# Patient Record
Sex: Male | Born: 1970 | Race: Black or African American | Hispanic: No | Marital: Single | Smoking: Current every day smoker
Health system: Southern US, Community
[De-identification: ages and names within clinical notes are randomized; demographics above are authoritative.]

## PROBLEM LIST (undated history)

## (undated) DIAGNOSIS — G709 Myoneural disorder, unspecified: Secondary | ICD-10-CM

## (undated) DIAGNOSIS — F32A Depression, unspecified: Secondary | ICD-10-CM

## (undated) DIAGNOSIS — Z9889 Other specified postprocedural states: Secondary | ICD-10-CM

## (undated) DIAGNOSIS — K219 Gastro-esophageal reflux disease without esophagitis: Secondary | ICD-10-CM

## (undated) DIAGNOSIS — R1310 Dysphagia, unspecified: Secondary | ICD-10-CM

## (undated) DIAGNOSIS — Z789 Other specified health status: Secondary | ICD-10-CM

## (undated) DIAGNOSIS — E349 Endocrine disorder, unspecified: Secondary | ICD-10-CM

## (undated) DIAGNOSIS — F640 Transsexualism: Secondary | ICD-10-CM

## (undated) DIAGNOSIS — B20 Human immunodeficiency virus [HIV] disease: Secondary | ICD-10-CM

## (undated) DIAGNOSIS — I10 Essential (primary) hypertension: Secondary | ICD-10-CM

## (undated) DIAGNOSIS — IMO0001 Reserved for inherently not codable concepts without codable children: Secondary | ICD-10-CM

## (undated) DIAGNOSIS — F419 Anxiety disorder, unspecified: Secondary | ICD-10-CM

## (undated) DIAGNOSIS — E785 Hyperlipidemia, unspecified: Secondary | ICD-10-CM

## (undated) DIAGNOSIS — F329 Major depressive disorder, single episode, unspecified: Secondary | ICD-10-CM

## (undated) DIAGNOSIS — I2510 Atherosclerotic heart disease of native coronary artery without angina pectoris: Secondary | ICD-10-CM

## (undated) DIAGNOSIS — F4320 Adjustment disorder, unspecified: Secondary | ICD-10-CM

## (undated) DIAGNOSIS — F1910 Other psychoactive substance abuse, uncomplicated: Secondary | ICD-10-CM

## (undated) DIAGNOSIS — F4323 Adjustment disorder with mixed anxiety and depressed mood: Secondary | ICD-10-CM

## (undated) DIAGNOSIS — R112 Nausea with vomiting, unspecified: Secondary | ICD-10-CM

## (undated) DIAGNOSIS — G510 Bell's palsy: Secondary | ICD-10-CM

## (undated) DIAGNOSIS — I219 Acute myocardial infarction, unspecified: Secondary | ICD-10-CM

## (undated) HISTORY — DX: Depression, unspecified: F32.A

## (undated) HISTORY — DX: Atherosclerotic heart disease of native coronary artery without angina pectoris: I25.10

## (undated) HISTORY — PX: ENDOVASCULAR STENT INSERTION: SHX5161

## (undated) HISTORY — DX: Bell's palsy: G51.0

## (undated) HISTORY — DX: Adjustment disorder with mixed anxiety and depressed mood: F43.23

## (undated) HISTORY — DX: Hyperlipidemia, unspecified: E78.5

## (undated) HISTORY — DX: Endocrine disorder, unspecified: E34.9

## (undated) HISTORY — DX: Dysphagia, unspecified: R13.10

## (undated) HISTORY — DX: Myoneural disorder, unspecified: G70.9

## (undated) HISTORY — DX: Acute myocardial infarction, unspecified: I21.9

## (undated) HISTORY — DX: Adjustment disorder, unspecified: F43.20

## (undated) HISTORY — DX: Essential (primary) hypertension: I10

## (undated) HISTORY — DX: Anxiety disorder, unspecified: F41.9

## (undated) HISTORY — DX: Reserved for inherently not codable concepts without codable children: IMO0001

## (undated) HISTORY — DX: Gastro-esophageal reflux disease without esophagitis: K21.9

## (undated) HISTORY — DX: Transsexualism: F64.0

## (undated) HISTORY — DX: Other psychoactive substance abuse, uncomplicated: F19.10

## (undated) HISTORY — DX: Major depressive disorder, single episode, unspecified: F32.9

## (undated) HISTORY — DX: Human immunodeficiency virus (HIV) disease: B20

---
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
## 2001-11-28 ENCOUNTER — Emergency Department (HOSPITAL_COMMUNITY): Admission: EM | Admit: 2001-11-28 | Discharge: 2001-11-28 | Payer: Self-pay | Admitting: Emergency Medicine

## 2001-11-29 ENCOUNTER — Emergency Department (HOSPITAL_COMMUNITY): Admission: EM | Admit: 2001-11-29 | Discharge: 2001-11-29 | Payer: Self-pay | Admitting: Emergency Medicine

## 2004-04-25 ENCOUNTER — Inpatient Hospital Stay (HOSPITAL_COMMUNITY): Admission: EM | Admit: 2004-04-25 | Discharge: 2004-04-28 | Payer: Self-pay | Admitting: Emergency Medicine

## 2007-08-14 ENCOUNTER — Ambulatory Visit: Payer: Self-pay | Admitting: Family Medicine

## 2007-08-15 ENCOUNTER — Ambulatory Visit: Payer: Self-pay | Admitting: *Deleted

## 2007-09-04 ENCOUNTER — Ambulatory Visit: Payer: Self-pay | Admitting: Family Medicine

## 2007-10-20 ENCOUNTER — Ambulatory Visit: Payer: Self-pay | Admitting: Family Medicine

## 2007-10-26 ENCOUNTER — Emergency Department (HOSPITAL_COMMUNITY): Admission: EM | Admit: 2007-10-26 | Discharge: 2007-10-26 | Payer: Self-pay | Admitting: Emergency Medicine

## 2007-12-11 ENCOUNTER — Ambulatory Visit: Payer: Self-pay | Admitting: Family Medicine

## 2007-12-11 LAB — CONVERTED CEMR LAB
ALT: 33 units/L (ref 0–53)
BUN: 18 mg/dL (ref 6–23)
CO2: 20 meq/L (ref 19–32)
Calcium: 8.8 mg/dL (ref 8.4–10.5)
Chloride: 109 meq/L (ref 96–112)
Cholesterol: 168 mg/dL (ref 0–200)
Creatinine, Ser: 0.96 mg/dL (ref 0.40–1.50)
Eosinophils Absolute: 0.1 10*3/uL — ABNORMAL LOW (ref 0.2–0.7)
Eosinophils Relative: 1 % (ref 0–5)
Free T4: 1.31 ng/dL (ref 0.89–1.80)
Glucose, Bld: 89 mg/dL (ref 70–99)
HCT: 43.9 % (ref 39.0–52.0)
HIV-2 Ab: NEGATIVE
Hemoglobin: 14.1 g/dL (ref 13.0–17.0)
Lymphocytes Relative: 41 % (ref 12–46)
Lymphs Abs: 4.7 10*3/uL — ABNORMAL HIGH (ref 0.7–4.0)
MCV: 84.9 fL (ref 78.0–100.0)
Monocytes Absolute: 1.1 10*3/uL — ABNORMAL HIGH (ref 0.1–1.0)
Monocytes Relative: 10 % (ref 3–12)
Total Bilirubin: 0.5 mg/dL (ref 0.3–1.2)
Total CHOL/HDL Ratio: 5.8
Triglycerides: 249 mg/dL — ABNORMAL HIGH (ref <150)
WBC: 11.6 10*3/uL — ABNORMAL HIGH (ref 4.0–10.5)

## 2007-12-18 ENCOUNTER — Encounter (INDEPENDENT_AMBULATORY_CARE_PROVIDER_SITE_OTHER): Payer: Self-pay | Admitting: Family Medicine

## 2007-12-18 ENCOUNTER — Ambulatory Visit: Payer: Self-pay | Admitting: Internal Medicine

## 2007-12-18 LAB — CONVERTED CEMR LAB
Absolute CD4: 956 #/uL (ref 381–1469)
Hep B S Ab: NEGATIVE
Hepatitis B Surface Ag: NEGATIVE
Total Lymphocytes %: 41 % (ref 12–46)
Total lymphocyte count: 4551 cells/mcL — ABNORMAL HIGH (ref 700–3300)

## 2008-01-05 ENCOUNTER — Ambulatory Visit: Payer: Self-pay | Admitting: Internal Medicine

## 2009-08-23 ENCOUNTER — Emergency Department (HOSPITAL_COMMUNITY): Admission: EM | Admit: 2009-08-23 | Discharge: 2009-08-24 | Payer: Self-pay | Admitting: Emergency Medicine

## 2009-12-08 LAB — CONVERTED CEMR LAB
CD4 Count: 465 microliters
Glucose, Bld: 77 mg/dL
HCV Ab: NEGATIVE
HIV 1 RNA Quant: 242580 copies/mL
RPR Ser Ql: NONREACTIVE

## 2010-03-03 LAB — CONVERTED CEMR LAB
Hemoglobin: 12.3 g/dL
WBC: 12 10*3/uL

## 2010-05-09 LAB — CONVERTED CEMR LAB
CD4 Count: 720 microliters
HIV 1 RNA Quant: 260 copies/mL
WBC: 9.7 10*3/uL

## 2010-07-28 ENCOUNTER — Telehealth: Payer: Self-pay

## 2010-07-28 DIAGNOSIS — B20 Human immunodeficiency virus [HIV] disease: Secondary | ICD-10-CM | POA: Insufficient documentation

## 2010-08-14 ENCOUNTER — Encounter: Payer: Self-pay | Admitting: Infectious Disease

## 2010-08-14 ENCOUNTER — Encounter (INDEPENDENT_AMBULATORY_CARE_PROVIDER_SITE_OTHER): Payer: Self-pay | Admitting: *Deleted

## 2010-08-14 ENCOUNTER — Telehealth: Payer: Self-pay

## 2010-08-15 ENCOUNTER — Encounter: Payer: Self-pay | Admitting: Infectious Disease

## 2010-08-15 ENCOUNTER — Ambulatory Visit: Payer: Self-pay | Admitting: Infectious Diseases

## 2010-08-15 LAB — CONVERTED CEMR LAB
Albumin: 3.9 g/dL (ref 3.5–5.2)
Alkaline Phosphatase: 111 units/L (ref 39–117)
CO2: 21 meq/L (ref 19–32)
Chloride: 104 meq/L (ref 96–112)
Cholesterol: 230 mg/dL — ABNORMAL HIGH (ref 0–200)
Glucose, Bld: 123 mg/dL — ABNORMAL HIGH (ref 70–99)
HIV-1 RNA Quant, Log: 2.33 — ABNORMAL HIGH (ref <1.68)
HIV-1 antibody: POSITIVE — AB
Hep A Total Ab: POSITIVE — AB
Hep B S Ab: POSITIVE — AB
LDL Cholesterol: 131 mg/dL — ABNORMAL HIGH (ref 0–99)
Lymphocytes Relative: 34 % (ref 12–46)
Lymphs Abs: 3.4 10*3/uL (ref 0.7–4.0)
Neutro Abs: 6.1 10*3/uL (ref 1.7–7.7)
Neutrophils Relative %: 60 % (ref 43–77)
Platelets: 394 10*3/uL (ref 150–400)
Potassium: 3.8 meq/L (ref 3.5–5.3)
Sodium: 137 meq/L (ref 135–145)
Total Protein: 7.8 g/dL (ref 6.0–8.3)
Triglycerides: 304 mg/dL — ABNORMAL HIGH (ref <150)
WBC: 10.1 10*3/uL (ref 4.0–10.5)

## 2010-08-31 ENCOUNTER — Encounter (INDEPENDENT_AMBULATORY_CARE_PROVIDER_SITE_OTHER): Payer: Self-pay | Admitting: *Deleted

## 2010-09-06 ENCOUNTER — Ambulatory Visit: Payer: Self-pay | Admitting: Infectious Disease

## 2010-09-06 DIAGNOSIS — I2510 Atherosclerotic heart disease of native coronary artery without angina pectoris: Secondary | ICD-10-CM | POA: Insufficient documentation

## 2010-09-06 DIAGNOSIS — E785 Hyperlipidemia, unspecified: Secondary | ICD-10-CM | POA: Insufficient documentation

## 2010-09-06 DIAGNOSIS — G609 Hereditary and idiopathic neuropathy, unspecified: Secondary | ICD-10-CM | POA: Insufficient documentation

## 2010-09-08 ENCOUNTER — Encounter (INDEPENDENT_AMBULATORY_CARE_PROVIDER_SITE_OTHER): Payer: Self-pay | Admitting: *Deleted

## 2010-09-11 ENCOUNTER — Encounter (INDEPENDENT_AMBULATORY_CARE_PROVIDER_SITE_OTHER): Payer: Self-pay | Admitting: *Deleted

## 2010-09-12 ENCOUNTER — Telehealth (INDEPENDENT_AMBULATORY_CARE_PROVIDER_SITE_OTHER): Payer: Self-pay | Admitting: *Deleted

## 2010-09-13 ENCOUNTER — Encounter (INDEPENDENT_AMBULATORY_CARE_PROVIDER_SITE_OTHER): Payer: Self-pay | Admitting: *Deleted

## 2010-09-27 ENCOUNTER — Encounter (INDEPENDENT_AMBULATORY_CARE_PROVIDER_SITE_OTHER): Payer: Self-pay | Admitting: *Deleted

## 2010-09-27 ENCOUNTER — Telehealth (INDEPENDENT_AMBULATORY_CARE_PROVIDER_SITE_OTHER): Payer: Self-pay | Admitting: *Deleted

## 2010-10-02 ENCOUNTER — Encounter (INDEPENDENT_AMBULATORY_CARE_PROVIDER_SITE_OTHER): Payer: Self-pay | Admitting: *Deleted

## 2010-11-16 ENCOUNTER — Telehealth: Payer: Self-pay | Admitting: Internal Medicine

## 2011-01-08 ENCOUNTER — Telehealth: Payer: Self-pay | Admitting: Internal Medicine

## 2011-01-15 ENCOUNTER — Telehealth: Payer: Self-pay | Admitting: Infectious Disease

## 2011-01-31 ENCOUNTER — Encounter (INDEPENDENT_AMBULATORY_CARE_PROVIDER_SITE_OTHER): Payer: Self-pay | Admitting: *Deleted

## 2011-02-01 NOTE — Letter (Signed)
Summary: Douglas Salazar: Income Verification  Douglas Salazar: Income Verification   Imported By: Florinda Marker 08/21/2010 16:04:27  _____________________________________________________________________  External Attachment:    Type:   Image     Comment:   External Document

## 2011-02-01 NOTE — Progress Notes (Signed)
Summary: Pt assist med arrived 3 mos supply nxt reorder due 11/12/10  Prescriptions: NEURONTIN 600 MG TABS (GABAPENTIN) one three times a day  #300 x 0   Entered by:   Paulo Fruit  BS,CPht II,MPH   Authorized by:   Acey Lav MD   Signed by:   Paulo Fruit  BS,CPht II,MPH on 09/27/2010   Method used:   Samples Given   RxID:   [id number]   Patient Assist Medication Verification: Medication: neurontin 600mg  Lot# [id number] Exp Date:Jul 13 Tech approval:mld  Sent a flag to the [facility] to inform the patient that this medication is available for pick up at the office Paulo Fruit  BS,CPht II,MPH  September 27, 2010 8:57 AM

## 2011-02-01 NOTE — Miscellaneous (Signed)
Summary: Bridge Counselor  Clinical Lists Changes BC mailed pt's RW card today at pt's request. Pt's application for assistance with his nuerontin was mailed today as well. If pt is approved, his medication will be shipped to [facility]. BC will coordinate with Byrd Hesselbach to dispense pt's medicaiton if pt is approved.  Sharol Roussel  September 14, 2010 4:35 PM

## 2011-02-01 NOTE — Progress Notes (Signed)
Summary: Requesting patient assistance meds.   Phone Note Call from Patient   Summary of Call: patient's Mother her to pick up medications.  She states they come from patient's assistance.  We were not aware they needed meds.  We will call to request medications.  Call Elberta Leatherwood (Mother) [phone]. Tomasita Morrow RN  January 08, 2011 4:54 PM

## 2011-02-01 NOTE — Progress Notes (Signed)
Summary: No show for new patient intake 07-27-10  Phone Note Outgoing Call   Call placed by: Tomasita Morrow RN,  July 28, 2010 9:54 AM Call placed to: [facility] counselor Summary of Call: Pt was a no show for new patient referral.   He was referral from the [facility] . I will make referral to [facility] for assistance.  Tomasita Morrow RN  July 28, 2010 9:56 AM   New Problems: HIV INFECTION (ICD-042)   New Problems: HIV INFECTION (ICD-042)

## 2011-02-01 NOTE — Miscellaneous (Signed)
Summary: Bridge Counselor  Clinical Lists Changes BC assisted pt with applying for a patient assistance program through [employer] for his nuerotin. BC gave the prescriber section to Tameka to have Dr. Daiva Eves sign off on. Once [facility] receives the doctor's signature, [facility] will mail the application and support letter. BC will coordinate with Byrd Hesselbach when the medicaiton is to arrive at the clinic.  Sharol Roussel  September 12, 2010 2:46 PM

## 2011-02-01 NOTE — Miscellaneous (Signed)
Summary: Bridge Counselor  Clinical Lists Changes Pt was approved for the patient assistance program to access his neurotin. BC called pt to let him know that his medication is here for pick up. Pt will be in tomorrow to see Byrd Hesselbach and get the medication. Pt very pleased.  Sharol Roussel  September 27, 2010 3:18 PM

## 2011-02-01 NOTE — Miscellaneous (Signed)
Summary: clinical update/ryan white ncadap apprv til 03/31/11  Clinical Lists Changes  Observations: Added new observation of RWTITLE: B (09/08/2010 11:22) Added new observation of PAYOR: No Insurance (09/08/2010 11:22) Added new observation of INCOMESOURCE: none (09/08/2010 11:22) Added new observation of HOUSEINCOME: 0  (09/08/2010 11:22) Added new observation of #CHILD<18 IN: No  (09/08/2010 11:22) Added new observation of FAMILYSIZE: 1  (09/08/2010 11:22) Added new observation of HOUSING: Temporary  (09/08/2010 11:22) Added new observation of FINASSESSDT: 08/15/2010  (09/08/2010 11:22) Added new observation of YEARLYEXPEN: 0  (09/08/2010 11:22) Added new observation of INFECTDIS MD: Philipp Deputy  (09/08/2010 11:22) Added new observation of RACE: American Indian/[place] Native  (09/08/2010 11:22) Added new observation of GENDER: Male  (09/08/2010 11:22) Added new observation of MARITAL STAT: Single  (09/08/2010 11:22) Added new observation of LATINO/HISP: No  (09/08/2010 11:22) Added new observation of REC_MESSAGE: Yes  (09/08/2010 11:22) Added new observation of RECPHONECALL: Yes  (09/08/2010 11:22) Added new observation of REC_MAIL: No  (09/08/2010 11:22) Added new observation of RW VITAL STA: Active  (09/08/2010 11:22) Added new observation of PATNTCOUNTY: [place]  (09/08/2010 11:22) Added new observation of RWPARTICIP: Yes  (09/08/2010 11:22) Added new observation of AIDSDAP: Yes 2011  (09/08/2010 11:22)

## 2011-02-01 NOTE — Progress Notes (Signed)
  Phone Note Refill Request   Refills Requested: Medication #1:  NEURONTIN 600 MG TABS one three times a day  Medication #2:  PERCOCET 5-325 MG TABS take one tablet up to three times daily as needed pain Initial call taken by: Jimmy Footman, CMA,  November 16, 2010 12:06 PM    Prescriptions: NEURONTIN 600 MG TABS (GABAPENTIN) one three times a day  #300 x 1   Entered by:   Tomasita Morrow RN   Authorized by:   Yisroel Ramming MD   Signed by:   Tomasita Morrow RN on 11/16/2010   Method used:   Print then Give to Patient   RxID:   [id number] PERCOCET 5-325 MG TABS (OXYCODONE-ACETAMINOPHEN) take one tablet up to three times daily as needed pain  #90 x 0   Entered by:   Tomasita Morrow RN   Authorized by:   Yisroel Ramming MD   Signed by:   Tomasita Morrow RN on 11/16/2010   Method used:   Print then Give to Patient   RxID:   [id number]

## 2011-02-01 NOTE — Progress Notes (Signed)
Summary: Pt returning to care   Phone Note Other Incoming   Caller: Bridge counselor - Soledad Gerlach  Summary of Call: pt needs new OV and labs after recent incarceration ending 07-19-10 Tomasita Morrow RN  August 14, 2010 2:50 PM Labs and OV given with Dr Farrel Demark RN  August 14, 2010 2:54 PM   Initial call taken by: Tomasita Morrow RN,  August 14, 2010 2:51 PM

## 2011-02-01 NOTE — Miscellaneous (Signed)
Summary: Bridge Counselor  Clinical Lists Changes Pt into BC's office today. Pt brought his ADAP approval letter. BC gave a copy of this to [facility] for pt's ADAP file. Pt will see Dr. Daiva Eves on 09/06/10 to renew his Rx.  Sharol Roussel  August 31, 2010 12:23 PM

## 2011-02-01 NOTE — Assessment & Plan Note (Signed)
Summary: new 042/tkk   Visit Type:  Consult  CC:  new patient id.  History of Present Illness: 40 year old man previously diagnosed with HIV in 61, largely treated in [place]. Not known to me at time of visit but has recently completed time in a correctional facility in [place]. He has been on [place] for years and claims to have been well controlle, though only recent labs here have shown good virological control. Patient has additional comorbid conditions of CAD and is sp cardiac catheriztion. He claims that Dr. Sharyn Lull took him off of all his cardiac meds including his statin, plavix, asprin and bp meds. ( I do not have records on this). He is presribed nitroglycerin for chest pain which he occasionally develops "only with exertion," and for which he takes nitroglycerin. 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 After lenthyl considerawtion of options I explained to him we have made decision to go to boosted reyataz and truvada.   I spent greater than an hour with this pt incluiding greater than 50% of time with face to face counselling.  Problems Prior to Update: 1)  HIV Infection  (ICD-042)  Medications Prior to Update: 1)  Kaletra 200-50 Mg Tabs (Lopinavir-Ritonavir) 2)  Neurontin 600 Mg Tabs (Gabapentin) .... One Three Times A Day  Current Medications (verified): 1)  Neurontin 600 Mg Tabs (Gabapentin) .... One Three Times A Day 2)  Truvada 200-300 Mg Tabs (Emtricitabine-Tenofovir) .Marland Kitchen.. 1 Tablet Once Daily 3)  Percocet 5-325 Mg Tabs (Oxycodone-Acetaminophen) .... Take One Tablet Up To Three Times Daily As Needed Pain 4)  Nitrostat 0.4 Mg Subl (Nitroglycerin) .Marland Kitchen.. 1 Sublinguil As  Needed Chest Pain 5)  Reyataz 300 Mg Caps (Atazanavir Sulfate) .... Take One Tablet With Norvir and Truvada Once Daily 6)  Norvir 100 Mg Tabs (Ritonavir) .... Take One Tablet With Reyataz and Truvada 7)  Pravachol 40 Mg Tabs (Pravastatin Sodium) .... Take One Tablet Daily 8)  Marinol 2.5 Mg Caps (Dronabinol) .... Take 1 Tablet By Mouth Two Times A Day  Allergies (verified): 1)  ! Naprosyn 2)  ! Sulfa    Current Allergies (reviewed today): ! NAPROSYN ! SULFA Past History:  Past Medical History: HIV CAD Hyperlipidemia INcarceration  Past Surgical History: no recent surgeris  Social History: smoked marijuanaa  denies other subtance abuse including alcohol abuse  Review of Systems       The patient complains of chest pain.  The patient denies anorexia, fever, weight loss, weight gain, vision loss, decreased hearing, hoarseness, syncope, dyspnea on exertion, peripheral edema, prolonged cough, headaches, hemoptysis, abdominal pain, melena, hematochezia, severe indigestion/heartburn, hematuria, incontinence, genital sores, muscle weakness, suspicious skin lesions, transient blindness, difficulty walking, depression, unusual weight change, abnormal bleeding, and enlarged lymph nodes.    Vital Signs:  Patient profile:   40 year old male Height:      72 inches (182.88 cm) Weight:      219 pounds (99.55 kg) BMI:     29.81 Temp:     98.1 degrees F (36.72 degrees C) oral Pulse rate:   62 / minute BP sitting:   128 / 91  (left arm)  Vitals Entered By: Starleen Arms CMA (September 06, 2010 10:58 AM) CC: new patient id Is Patient Diabetic? No Pain Assessment Patient in pain? no      Nutritional Status BMI of 25 - 29 = overweight Nutritional Status Detail nl  Does patient need assistance? Functional Status Self care Ambulation Normal   Physical Exam  General:  alert, well-developed, and well-nourished.   Head:  normocephalic and atraumatic.   Eyes:  vision grossly  intact, pupils equal, pupils round, and pupils reactive to light.   Ears:  no external deformities.   Nose:  no external deformity.   Mouth:  pharynx pink and moist, no erythema, and no exudates.   Neck:  supple and full ROM.   Lungs:  normal respiratory effort, no crackles, and no wheezes.   Heart:  normal rate, regular rhythm, no murmur, no gallop, and no rub.   Abdomen:  soft, non-tender, normal bowel sounds, and no distention.   Msk:  normal ROM, no joint deformities, and no joint instability.   Extremities:  No clubbing, cyanosis, edema, or deformity noted with normal full range of motion of all joints.   Neurologic:  alert & oriented X3, strength normal in all extremities, and gait normal.   Skin:  color normal and no rashes.   Psych:  Oriented X3, memory intact for recent and remote, and good eye contact.          Medication Adherence: 09/06/2010   Adherence to medications reviewed with patient. Counseling to provide adequate adherence provided   Prevention For Positives: 09/06/2010   Safe sex practices discussed with patient. Condoms offered.   Education Materials Provided: 09/06/2010 Safe sex practices discussed with patient. Condoms offered.                          Impression & Recommendations:  Problem # 1:  HIV INFECTION (ICD-042) Will change him to reyataz, norvir and truvada to improve his lipids and reduce GI side effects Orders: T-GC Probe, urine ([id number]) T-Chlamydia  Probe, urine ([id number]) Consultation Level V (99245)Future Orders: T-CD4SP ([facility]) (CD4SP) ... 10/23/2010 T-HIV Viral Load [phone]) ... 10/23/2010 T-CBC w/Diff ([id number]) ... 10/23/2010 T-Comprehensive Metabolic Panel [phone]) ... 10/23/2010 T-Lipid Profile [phone]) ... 10/23/2010  Problem # 2:  CAD (ICD-414.00)  I will fax this note to Dr. Sharyn Lull and see if we can get records form him. The patient is still taking nitroglycerin for "chest pain" periodcially but  claims to not need plavix, statin etc. I am putting him on statin given LDL, and asking him to take aspirin. I am hihgly skeptical of his account of a stress test freeign him up from his dx of CAD. He may need a repeat one or cardiac cath if he is having exertional chest pain to the pt of being rx nitro by [facility] MDs. No worsening of chcest pain recetnly and none today in clinci His updated medication list for this problem includes:    Nitrostat 0.4 Mg Subl (Nitroglycerin) .Marland Kitchen... 1 sublinguil as needed chest pain  Orders: Consultation Level V (03474)  Problem # 3:  HYPERLIPIDEMIA (ICD-272.4)  start prvachol (on 4$ plan at [facility]) NO ZOCOR gien drug interaction with norvir. Lipitor or crestor would be fine if he can afford them His updated medication list for this problem includes:    Pravachol 40 Mg Tabs (Pravastatin sodium) .Marland Kitchen... Take one tablet daily  Orders: Consultation Level V (25956)  Problem # 4:  PERIPHERAL  NEUROPATHY (ICD-356.9)  gabapentin and he may have oxycodoen for breakthrough pain. Will have him sign pain contract at next visit  Orders: Consultation Level V [phone])  Medications Added to Medication List This Visit: 1)  Truvada 200-300 Mg Tabs (Emtricitabine-tenofovir) .Marland Kitchen.. 1 tablet once daily 2)  Percocet 5-325 Mg Tabs (Oxycodone-acetaminophen) .... Take one tablet up to three times daily as needed pain 3)  Nitrostat 0.4 Mg Subl (Nitroglycerin) .Marland Kitchen.. 1 sublinguil as needed chest pain 4)  Reyataz 300 Mg Caps (Atazanavir sulfate) .... Take one tablet with norvir and truvada once daily 5)  Norvir 100 Mg Tabs (Ritonavir) .... Take one tablet with reyataz and truvada 6)  Pravachol 40 Mg Tabs (Pravastatin sodium) .... Take one tablet daily 7)  Marinol 2.5 Mg Caps (Dronabinol) .... Take 1 tablet by mouth two times a day  Other Orders: Flu Vaccine 48yrs + (51884) Admin 1st Vaccine (16606)  Patient Instructions: 1)  rtc to see Dr. Daiva Eves in November 2)  Be sure to return  for lab work one (1) week before your next appointment as scheduled. 3)  come in fasting before lab tests 4)  Start an aspirin 325mg  daily  Prescriptions: MARINOL 2.5 MG CAPS (DRONABINOL) Take 1 tablet by mouth two times a day  #60 x 0   Entered and Authorized by:   Acey Lav MD   Signed by:   Paulette Blanch Dam MD on 09/06/2010   Method used:   Print then Give to Patient   RxID:   [id number] MARINOL 2.5 MG CAPS (DRONABINOL) Take 1 tablet by mouth two times a day  #60 x 3   Entered and Authorized by:   Acey Lav MD   Signed by:   Paulette Blanch Dam MD on 09/06/2010   Method used:   Print then Give to Patient   RxID:   [id number] PERCOCET 5-325 MG TABS (OXYCODONE-ACETAMINOPHEN) take one tablet up to three times daily as needed pain  #90 x 0   Entered and Authorized by:   Acey Lav MD   Signed by:   Paulette Blanch Dam MD on 09/06/2010   Method used:   Print then Give to Patient   RxID:   [id number] NITROSTAT 0.4 MG SUBL (NITROGLYCERIN) 1 sublinguil as needed chest pain  #1 x 5   Entered and Authorized by:   Acey Lav MD   Signed by:   Paulette Blanch Dam MD on 09/06/2010   Method used:   Print then Give to Patient   RxID:   [id number] TRUVADA 200-300 MG TABS (EMTRICITABINE-TENOFOVIR) 1 tablet once daily  #31 x 11   Entered and Authorized by:   Acey Lav MD   Signed by:   Paulette Blanch Dam MD on 09/06/2010   Method used:   Print then Give to Patient   RxID:   [id number] NEURONTIN 600 MG TABS (GABAPENTIN) one three times a day  #90 x 11   Entered and Authorized by:   Acey Lav MD   Signed by:   Paulette Blanch Dam MD on 09/06/2010   Method used:   Print then Give to Patient   RxID:   [id number] PRAVACHOL 40 MG TABS (PRAVASTATIN SODIUM) take one tablet daily  #30 x 11   Entered and Authorized by:   Acey Lav MD   Signed by:   Paulette Blanch Dam MD on 09/06/2010   Method used:   Print then Give to  Patient  RxID:   [id number] NORVIR 100 MG TABS (RITONAVIR) take one tablet with reyataz and truvada  #30 x 11   Entered and Authorized by:   Acey Lav MD   Signed by:   Paulette Blanch Dam MD on 09/06/2010   Method used:   Print then Give to Patient   RxID:   [id number] REYATAZ 300 MG CAPS (ATAZANAVIR SULFATE) take one tablet with norvir and truvada once daily  #30 x 11   Entered and Authorized by:   Acey Lav MD   Signed by:   Paulette Blanch Dam MD on 09/06/2010   Method used:   Print then Give to Patient   RxID:   [id number]    Immunizations Administered:  Influenza Vaccine # 1:    Vaccine Type: Fluvax 3+    Site: right deltoid    Mfr: novartis    Dose: 0.5 ml    Route: IM    Given by: Starleen Arms CMA    Exp. Date: 04/01/2011    Lot #: [id number]    VIS given: 07/24/07 version given September 06, 2010.  Flu Vaccine Consent Questions:    Do you have a history of severe allergic reactions to this vaccine? no    Any prior history of allergic reactions to egg and/or gelatin? no    Do you have a sensitivity to the preservative Thimersol? no    Do you have a past history of Guillan-Barre Syndrome? no    Do you currently have an acute febrile illness? no    Have you ever had a severe reaction to latex? no    Vaccine information given and explained to patient? yes         Medication Adherence: 09/06/2010   Adherence to medications reviewed with patient. Counseling to provide adequate adherence provided    Prevention For Positives: 09/06/2010   Safe sex practices discussed with patient. Condoms offered.

## 2011-02-01 NOTE — Miscellaneous (Signed)
Summary: Bridge Counselor  Clinical Lists Changes BC closed pt's file for bridge counseling today. Pt is doing well and has accessed all of his medications. Pt has contact info for [facility] should he need further case management. 

## 2011-02-01 NOTE — Assessment & Plan Note (Signed)
Summary: new 042 /tkk  Pt was recently in correctional facility in [place] , [place] .  No intake done.  Previous Patient.  Bridge Counselor located patient for OV.  Tomasita Morrow RN  September 06, 2010 9:20 AM     Immunization History:  Pneumovax Immunization History:    Pneumovax:  historical (05/25/2010)

## 2011-02-01 NOTE — Progress Notes (Signed)
Summary: RW card available for pickup/left msg for him to call office  Phone Note Outgoing Call   Call placed by: Paulo Fruit  BS,CPht II,MPH,  September 12, 2010 3:14 PM Call placed to: Patient Details for Reason: RW card ready for pick up Summary of Call: call placed to patient with a message for him to contact [facility] Initial call taken by: Paulo Fruit  BS,CPht II,MPH,  September 12, 2010 3:15 PM     Appended Document: RW card available for pickup/left msg for him to call office [facility] will give patient his RW card.  She sees him once a week. I will let patient know that she has his card if he returns Maria's call

## 2011-02-01 NOTE — Miscellaneous (Signed)
Summary: Bridge Counselor  Clinical Lists Changes BC met with and enrolled pt into the bridge counseling program today. Pt will be here tomorrow for his labs and to see Byrd Hesselbach for ADAP application.  [facility]  August 14, 2010 3:20 PM

## 2011-02-01 NOTE — Progress Notes (Signed)
Summary: Neurontin PAP arrived, pt. requested Percocet refill  Phone Note Outgoing Call   Call placed by: Jennet Maduro RN,  January 15, 2011 12:25 PM Call placed to: Patient Action Taken: Assistance medications ready for pick up Summary of Call: Neurontin 600 Mg tablets # 300 Lot [id number] Exp Oct 2013 Pt's mother informed that rx is ready for pick-up.  She asked about refill of Percocet.  RN will have to have this approved by MD. Jennet Maduro RN  January 15, 2011 12:29 PM   Follow-up for Phone Call        fine with me but he also needs to sign pain contract with [place] Follow-up by: Acey Lav MD,  January 15, 2011 4:34 PM  Additional Follow-up for Phone Call Additional follow up Details #1::        Message left on mother's phone to ask pt. to come to Center to pick up pain rx and Neurontin PAP.  RN placed Pain Contract in box with percocet rx for pt. to complete and sign. Jennet Maduro RN  January 16, 2011 9:51 AM     Prescriptions: PERCOCET 5-325 MG TABS (OXYCODONE-ACETAMINOPHEN) take one tablet up to three times daily as needed pain  #90 x 0   Entered and Authorized by:   Acey Lav MD   Signed by:   Paulette Blanch Dam MD on 01/15/2011   Method used:   Print then Give to Patient   RxID:   [id number] NEURONTIN 600 MG TABS (GABAPENTIN) one three times a day  #300 x 0   Entered by:   Jennet Maduro RN   Authorized by:   Acey Lav MD   Signed by:   Jennet Maduro RN on 01/15/2011   Method used:   Samples Given   RxID:   [id number]  Neurontin 600 Mg tablets # 300 Lot [id number] Exp Oct 2013  Jennet Maduro RN  January 15, 2011 12:30 PM

## 2011-02-02 NOTE — Miscellaneous (Signed)
Summary: HIPAA Restrictions  HIPAA Restrictions   Imported By: Florinda Marker 08/15/2010 15:14:07  _____________________________________________________________________  External Attachment:    Type:   Image     Comment:   External Document

## 2011-02-07 ENCOUNTER — Encounter (INDEPENDENT_AMBULATORY_CARE_PROVIDER_SITE_OTHER): Payer: Self-pay | Admitting: *Deleted

## 2011-02-07 NOTE — Miscellaneous (Signed)
  Clinical Lists Changes  Observations: Added new observation of HIV RISK BEH: Unknown (01/31/2011 11:55)

## 2011-02-15 NOTE — Miscellaneous (Signed)
  Clinical Lists Changes 

## 2011-02-19 ENCOUNTER — Encounter (INDEPENDENT_AMBULATORY_CARE_PROVIDER_SITE_OTHER): Payer: Self-pay | Admitting: *Deleted

## 2011-02-27 NOTE — Miscellaneous (Signed)
  Clinical Lists Changes  Observations: Added new observation of HIV RISK BEH: MSM (02/19/2011 12:02)

## 2011-02-27 NOTE — Miscellaneous (Signed)
  Clinical Lists Changes 

## 2011-03-15 LAB — T-HELPER CELL (CD4) - (RCID CLINIC ONLY): CD4 % Helper T Cell: 20 % — ABNORMAL LOW (ref 33–55)

## 2011-03-24 IMAGING — CT CT ANGIO CHEST
2 of 6 series · 18 of 46 positions shown · IV contrast (APPLIED)
Comparison: Chest radiograph performed earlier today at [DATE] p.m.

CLINICAL DATA: Chest pain, shortness of breath and nausea;
elevated D-dimer and leukocytosis.

CT ANGIOGRAPHY CHEST WITH CONTRAST
TECHNIQUE: Multidetector CT imaging of the chest was performed
using the standard protocol during bolus administration of
intravenous contrast. Multiplanar CT image reconstructions
including MIPs were obtained to evaluate the vascular anatomy.
Contrast: 80 mL of Omnipaque 300 IV contrast

[Series 6: pe thins @ 1mm · axial · 0.70mm/px · z∈[-112,+105]mm · 15 of 239 slices shown]
[im 11/239  lung]
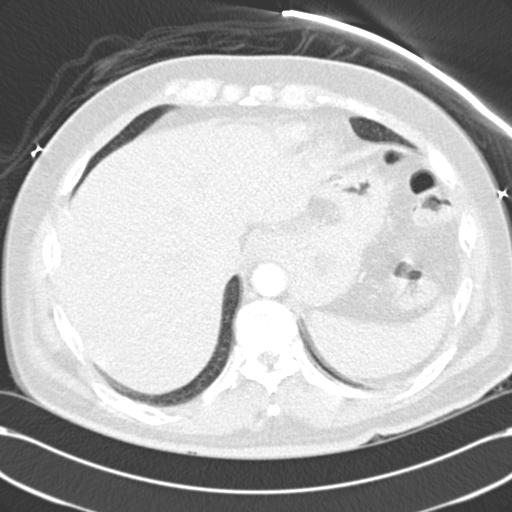
[im 32/239  soft-tissue]
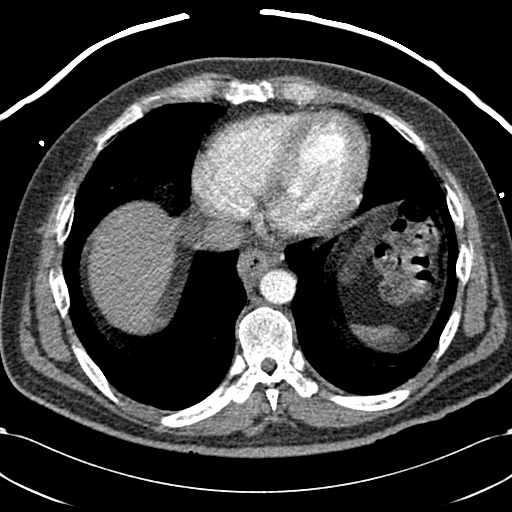
[im 42/239  lung]
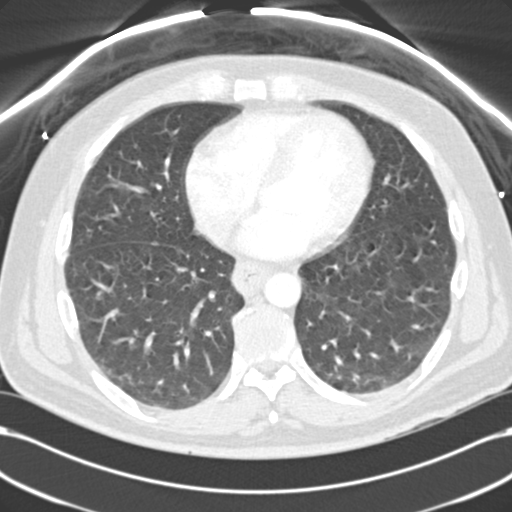
[im 63/239  soft-tissue]
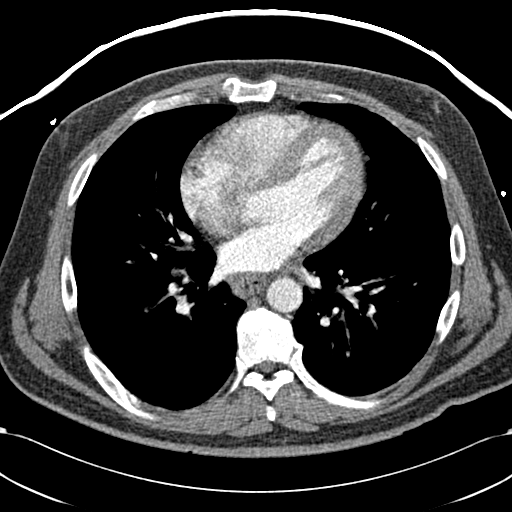
[im 73/239  lung]
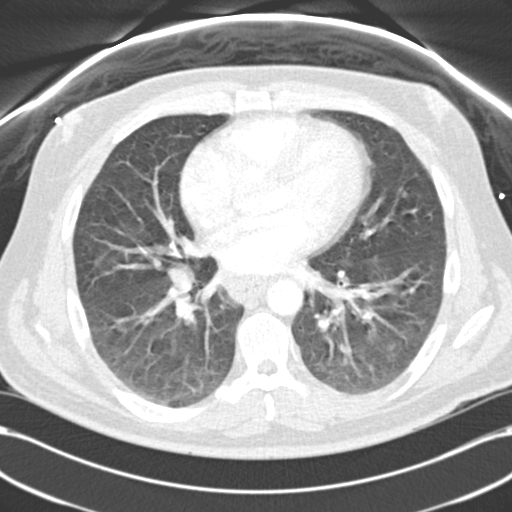
[im 94/239  soft-tissue]
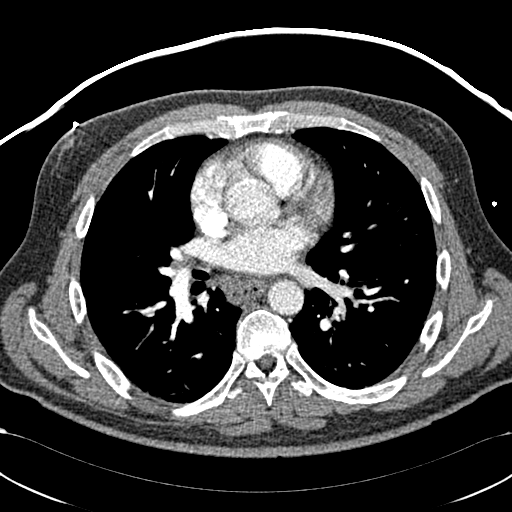
[im 104/239  lung]
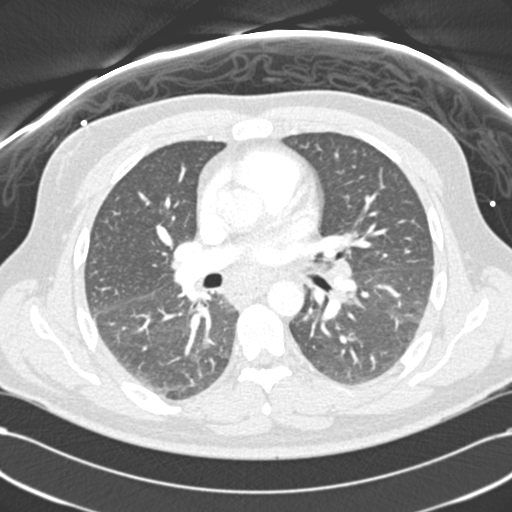
[im 125/239  soft-tissue]
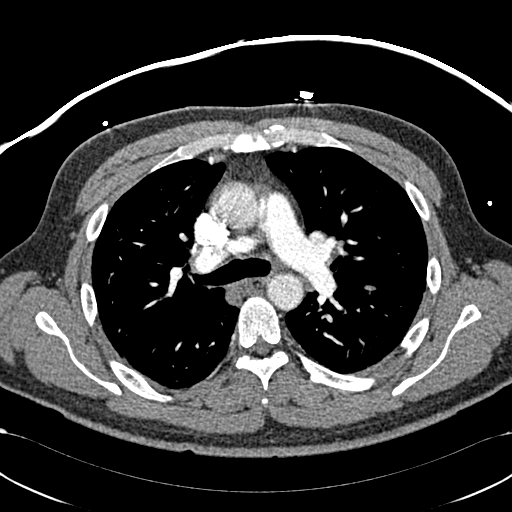
[im 135/239  lung]
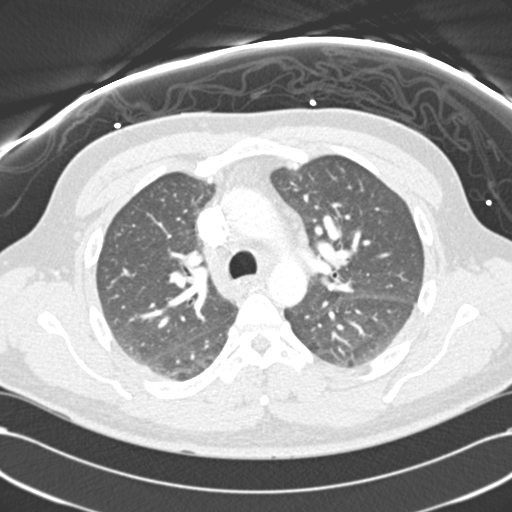
[im 145/239  soft-tissue]
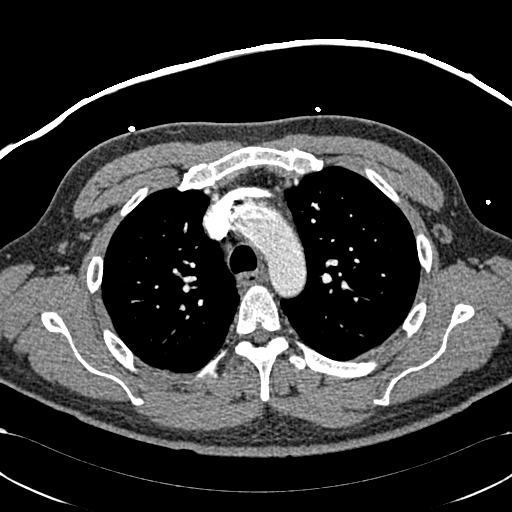
[im 166/239  lung]
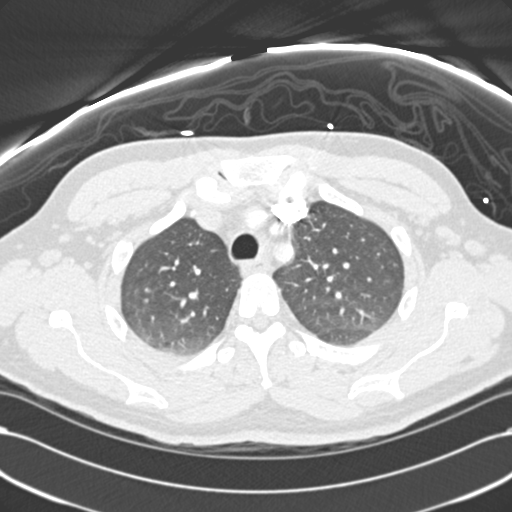
[im 176/239  soft-tissue]
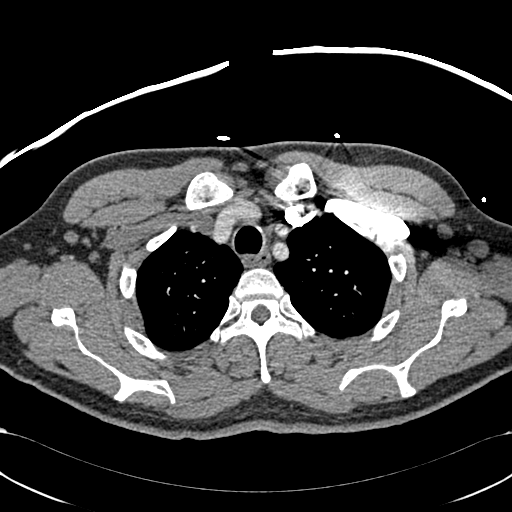
[im 197/239  lung]
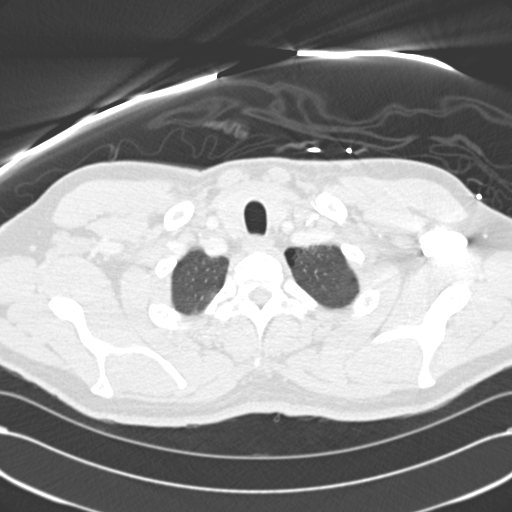
[im 207/239  soft-tissue]
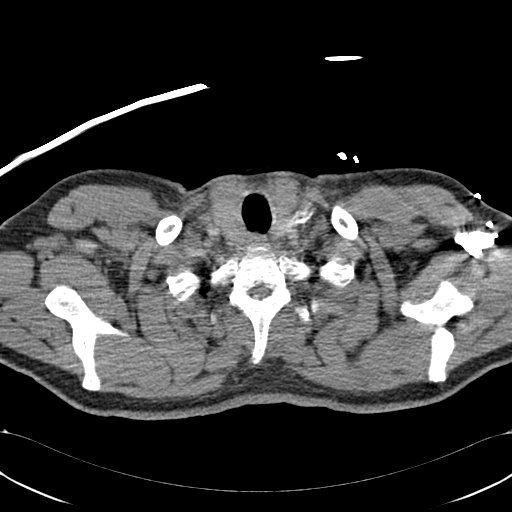
[im 228/239  lung]
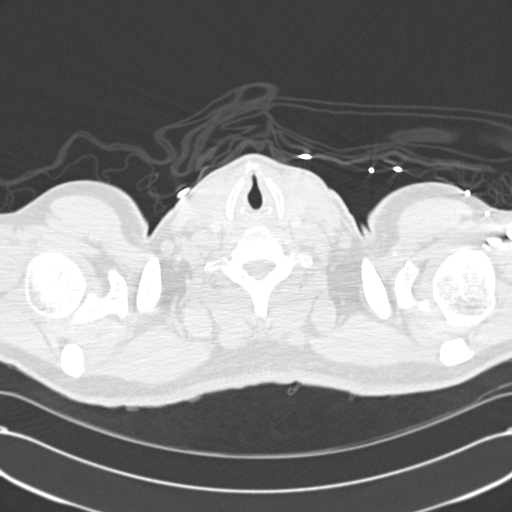

[Series 602: <mpr thick range> · coronal · 0.70mm/px · 3 of 118 slices shown]
[im 30/118  soft-tissue]
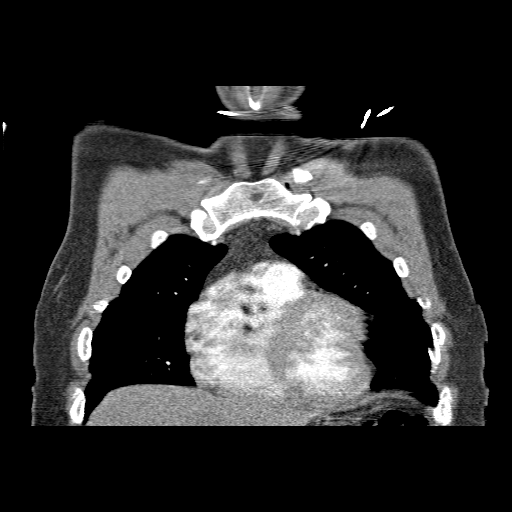
[im 59/118  soft-tissue]
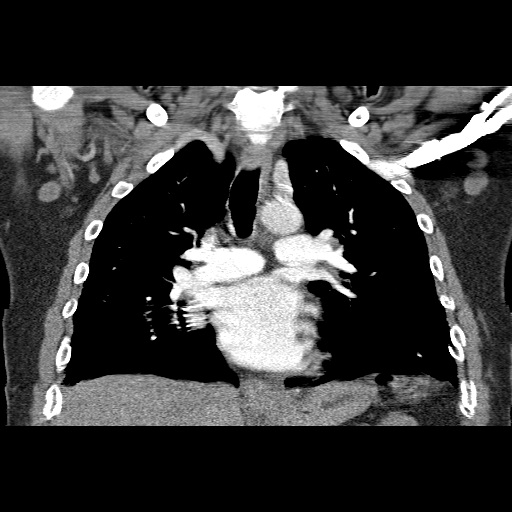
[im 88/118  soft-tissue]
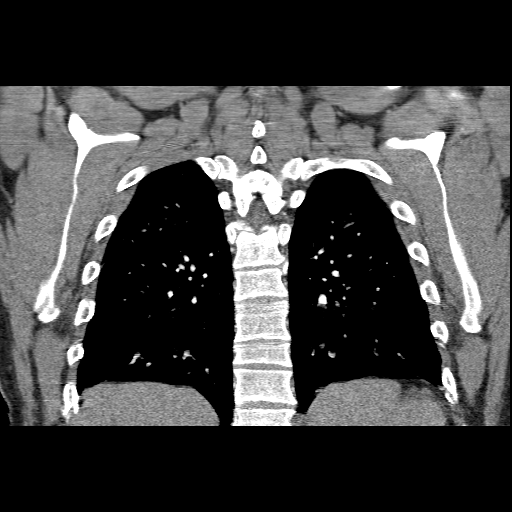

[18 of 46 positions shown; findings below may reference images not displayed]

FINDINGS: There is no evidence of pulmonary embolus. Evaluation is
suboptimal in the lower lung zones due to motion artifact.

Multiple small nodules are noted within both lungs, particularly in
the left upper lobe and right middle lobe.  In the left upper lobe,
there is a 6 mm nodule (image 52 of 77), a 5 mm nodule (image 37 of
77) and a 3 mm nodule (image 29 of 77).  Within the right middle
lobe, there is a 6 mm nodule (image 48 of 77).  There is also a
likely a lymph node noted along the left major fissure (image 38 of
77), which measures 9 mm in size.  Given the presence of multiple
bilateral nodules, these are likely infectious or inflammatory in
nature.

There is no evidence of significant focal consolidation, pleural
effusion or pneumothorax.  A small bleb is noted at the left lung
apex.  No masses are identified; no abnormal focal contrast
enhancement is seen.

Mildly enlarged hilar nodes are noted bilaterally.  No mediastinal
lymphadenopathy is seen.  The mediastinum is unremarkable in
appearance.  The origin of the left common carotid artery is
difficult to characterize due to artifact from the contrast bolus.
Numerous bilateral axillary lymph nodes are seen, without evidence
of abnormal axillary lymphadenopathy.  The thyroid gland is
unremarkable in appearance.

The visualized portions of the liver and spleen are unremarkable.
The distal esophagus appears mildly thick-walled, although this may
reflect relative decompression.  No acute osseous abnormalities are
seen. An apparently flattened vertebral body within the thoracic
spine on sagittal images likely reflects motion artifact.

Review of the MIP images confirms the above findings.
IMPRESSION: 1.  No evidence of pulmonary embolus.
2.  Multiple bilateral pulmonary nodules seen; these are likely
infectious or inflammatory in nature.  Mildly enlarged hilar nodes
noted bilaterally.
3.  Mildly thick-walled appearance to the distal esophagus.  No
focal mass identified.

## 2011-04-07 LAB — CBC
HCT: 39.6 % (ref 39.0–52.0)
Hemoglobin: 13.2 g/dL (ref 13.0–17.0)
MCV: 87.4 fL (ref 78.0–100.0)
RBC: 4.53 MIL/uL (ref 4.22–5.81)
WBC: 15.4 10*3/uL — ABNORMAL HIGH (ref 4.0–10.5)

## 2011-04-07 LAB — POCT CARDIAC MARKERS
CKMB, poc: 1.1 ng/mL (ref 1.0–8.0)
CKMB, poc: 1.7 ng/mL (ref 1.0–8.0)
Myoglobin, poc: 101 ng/mL (ref 12–200)
Troponin i, poc: <0.05 ng/mL (ref 0.00–0.09)

## 2011-04-07 LAB — DIFFERENTIAL
Lymphocytes Relative: 18 % (ref 12–46)
Lymphs Abs: 2.8 10*3/uL (ref 0.7–4.0)
Monocytes Relative: 6 % (ref 3–12)
Neutro Abs: 11.6 10*3/uL — ABNORMAL HIGH (ref 1.7–7.7)
Neutrophils Relative %: 75 % (ref 43–77)

## 2011-04-07 LAB — TROPONIN I: Troponin I: 0.01 ng/mL (ref 0.00–0.06)

## 2011-04-07 LAB — BASIC METABOLIC PANEL
BUN: 13 mg/dL (ref 6–23)
Chloride: 111 mEq/L (ref 96–112)
GFR calc Af Amer: >60 mL/min (ref >60)
Potassium: 3.5 mEq/L (ref 3.5–5.1)

## 2011-04-07 LAB — HEPATIC FUNCTION PANEL
Bilirubin, Direct: 0.1 mg/dL (ref 0.0–0.3)
Indirect Bilirubin: 0.6 mg/dL (ref 0.3–0.9)
Total Bilirubin: 0.7 mg/dL (ref 0.3–1.2)

## 2011-05-18 NOTE — Op Note (Signed)
NAMEAMMAAR, ENCINA NO.:  [id number]   MEDICAL RECORD NO.:  [id number]                   PATIENT TYPE:  INP   LOCATION:  2931                                 FACILITY:  [facility]   PHYSICIAN:  Eduardo Osier. Sharyn Lull, M.D.              DATE OF BIRTH:  1971/11/26   DATE OF PROCEDURE:  04/25/2004  DATE OF DISCHARGE:                                 OPERATIVE REPORT   PROCEDURE PERFORMED:  1. Left cardiac catheterization with selective left and right coronary     angiography, left ventriculography via right groin using Judkins     technique.  2. Successful PTCA to distal RCA using 2.5 x 12 mm long __________  balloon.  3. Successful deployment of 2.5 x 16 mm long Taxus drug alluding stent in     distal RCA.  4. Successful insertion of temporary transvenous pacemaker via right femoral     venous approach.   INDICATIONS:  Mr. Douglas Salazar is a 40 year old black male with no  significant past medical history except for tobacco abuse and positive  family history of coronary artery disease.  He came to the ER complaining of  retrosternal chest pain off and on since yesterday morning without  associated symptoms of nausea, vomiting, or diaphoresis.  Denies any  palpitation, lightheadedness or syncope.  Denies PND, orthopnea, or leg  swelling.  Denies cocaine abuse.  The patient went to [facility]  and left AMA and came to [facility].  EKG done in the ER showed  sinus bradycardia, nondiagnostic Q wave in inferior leads with early  repolarization changes and nonspecific ST-T wave changes and was noted to  have minimally elevated troponin I and CPK on __________  .  The patient  states chest pain occasionally increases with coughing and deep breathing.  Denies any sore throat, cough, fever or chills.  The patient continued to  have vague chest pain without any associated symptoms.  Repeat CPKs were  further elevated to 336, MB of 34.2 with relative  index of 10.2, and  troponin I was increased to 1.05.  EKG showed similar changes of sinus  bradycardia with ST elevation and inferolateral leads suggestive of early  repolarization versus acute MI with nonspecific ST-T wave changes.  Discussed with patient about his increasing CPK, troponin I and EKG changes  and with his mother regarding possible PTCA stenting, its risks, benefits,  i.e., death, MI, stroke, need for emergency CABG, risk of restenosis, other  vascular complications, etcetera, and consented for PCI.   DESCRIPTION OF PROCEDURE:  After obtaining the informed consent, the patient  was brought to the cath lab and was placed on the fluoroscopy table. The  right groin was prepped and draped in the usual fashion. Xylocaine 2% was  used for local anesthesia in the right groin. With the help of thin wall  needle a 7 French arterial and  6 French venous sheaths were placed.  Both  these sheaths were aspirated and flushed.   Next, a 6 French left Judkins catheter was advanced over the wire under  fluoroscopic guidance up to the ascending aorta. The wire was pulled out,  the catheter was aspirated and connected to the manifold. The catheter was  further advanced and engaged into the left coronary ostium.  Multiple views  of the left system were taken.   Next, the catheter was disengaged and was pulled out over the wire and was  replaced with a 6 French right Judkins catheter which was advanced over the  wire under fluoroscopic guidance up to the ascending aorta. The wire was  pulled out, the catheter was aspirated and connected to the manifold. The  catheter was further advanced and engaged into the right coronary ostium.  Multiple views of the right system were taken.   Next, the catheter was disengaged and was pulled out over the wire and was  replaced with a 6-French pigtail catheter which was advanced over the wire  under fluoroscopic guidance up to the ascending aorta. The  catheter was  further advanced across the aortic valve into the LV.  LV pressures were  recorded.   Next, LV graft was done in 30-degree RAO position. Most angiographic  pressures were recorded from LV and then pullback pressures were recorded  from the aorta. There was no gradient across the aortic valve.   Next, the pigtail catheter was pulled out over the wire, sheaths were  aspirated and flushed.   FINDINGS:  LV showed mild inferior wall hypokinesia with EF of 50-55%.  The  left main was patent. LAD was patent.  Diagonal 1 to diagonal 3 were small  which were patent.  The ramus was moderate size which was patent.  Left  circumflex has 10-15% proximal stenosis.  RCA was 100% occluded distally  filling by collaterals from left system.   INTERVENTIONAL PROCEDURES:  Successful PTCA to distal RCA was done using 2.5  x 12 mm long __________  balloon for predilatation and then 2.5 x 16 mm long  Taxus drug eluting stent was deployed at __________  muscle pressure which  was fully expanded going up to 13 at muscle pressure lesion was dilated from  100% to 0% __________  with excellent TIMI grade 3 distal flow without  evidence of dissection or distal embolization.  The patient received  __________  11 mg of Plavix during the procedure.  Temporary pacer was  inserted prior to the procedure via right femoral venous approach without  complications which were discontinued at the end of the procedure.  The  patient tolerated the procedure well.  There were no complications.  The  patient was transferred to the recovery room in stable condition.      ___________________________________________                                            Eduardo Osier Sharyn Lull, M.D.   MNH/MEDQ  D:  04/25/2004  T:  04/26/2004  Job:  [id number]   cc:   Cath Lab   Eduardo Osier. Sharyn Lull, M.D.  [address]  [place]  [place] [address]  Fax: [phone]

## 2011-05-18 NOTE — Discharge Summary (Signed)
NAMEAMEDEE, CERRONE NO.:  [id number]   MEDICAL RECORD NO.:  [id number]                   PATIENT TYPE:  INP   LOCATION:  2024                                 FACILITY:  [facility]   PHYSICIAN:  Ricki Rodriguez, M.D.               DATE OF BIRTH:  [birth year]   DATE OF ADMISSION:  04/25/2004  DATE OF DISCHARGE:  04/28/2004                                 DISCHARGE SUMMARY   DISCHARGING PHYSICIAN:  Dr. Ricki Rodriguez.   PRINCIPAL DIAGNOSES:  1. Acute inferior wall myocardial infarction.  2. Coronary artery disease of native vessel.  3. Family history of ischemic heart disease.  4. History of tobacco use.   PRINCIPAL PROCEDURE:  Left heart catheterization, selective coronary  angiography and insertion of drug-eluting coronary artery stent placement on  April 25, 2004 by Dr. Eduardo Osier. Harwani.   DISCHARGE MEDICATIONS:  1. Aspirin 325 mg 1 p.o. daily.  2. Lopressor 50 mg half twice daily.  3. Xanax 0.25 mg 1 twice daily.  4. Plavix 75 mg 1 daily.  5. Protonix 40 mg 1 daily.  6. Nitroglycerin tablet 0.4 mg every 5 minutes x3 as needed for chest pain     as directed.  7. Lipitor 40 mg 1 daily.   PAIN MANAGEMENT:  Patient to use Tylenol as needed.   ACTIVITY:  Activity as tolerated.   DIET:  Patient to avoid sweets.   WOUND CARE INSTRUCTIONS:  Patient to notify if right groin pain, swelling or  discharge.   FOLLOWUP:  Followup by Dr. Sharyn Lull in 2 weeks.   HISTORY:  This 40 year old black male, with a history of tobacco abuse and a  family history of coronary artery disease, came to the emergency room on  April 25, 2004 with chest pain off and on for 24 hours.   PHYSICAL EXAMINATION:  GENERAL:  The patient was alert, oriented x3, in no  acute distress.  VITAL SIGNS:  Pulse 52, respirations 16, blood pressure 104/66.  HEENT:  Conjunctivae pink.  NECK:  Neck supple.  No JVD, no carotid bruit.  LUNGS:  Decreased breath sounds at bases with poor  effort.  CARDIOVASCULAR:  Regular rate and rhythm, negative S3 gallop and a negative  S4.  ABDOMEN:  Abdomen soft, bowel sounds present, nontender.  EXTREMITIES:  No edema, cyanosis or clubbing.   LABORATORY DATA:  Laboratory data revealed a hemoglobin of 11.4, hematocrit  of 33.9, WBC count elevated at 17,500; subsequent WBC count was 12,500.  Platelet count was normal at 219,000.  PT was 12.5 with INR of 0.9.  Sodium  143, potassium 3.3; subsequent potassium was 3.5 mEq/L.  Sugar borderline at  111 and 232.  Albumin low at 2.9; liver function tests otherwise normal.  Magnesium level was 1.8.  Total CK was 335 with an MB of 34 and troponin L  of 1.05; subsequent CK was 353 with  an MB of 41.3 and troponin I of 7.28 and  a final CK was 82 with an MB of 3.1 and troponin I of 3.68 on April 27, 2004.  Lipid profile showed total cholesterol of 127 with HDL cholesterol  low at 36 and LDL cholesterol of 63; triglycerides was 140.  C-reactive  protein was 1.  Cocaine was positive on blood test.   EKG was sinus rhythm with inferoposterior infarct.   Chest x-ray was suboptimal inspiration with mild bibasilar atelectasis.   Cardiac catheterization showed mild disease of left circumflex coronary  artery and 100% distal occlusion of right coronary artery.   HOSPITAL COURSE:  The patient was admitted to telemetry unit after emergent  cardiac catheterization that showed 100% distal RCA occlusion.  This was  reduced to 0% by deploying a TAXUS drug-eluting stent at 11 atmospheres to  13 atmospheres.  The stent was 2.5 x 16.0 mm in size.  There was pre-  dilatation using 2.5 x 12.0-mm long Voyager balloon.  Patient tolerated the  procedure well, had routine post-angioplasty care.  He was strongly advised  to refrain from cocaine use.  He was placed on cholesterol-lowering  medication, along with aspirin, Plavix, Lopressor, Protonix and Xanax.  He  was discharged home on April 28, 2004 in satisfactory  condition with  followup by Dr. Sharyn Lull in 2 weeks.                                                Ricki Rodriguez, M.D.    ASK/MEDQ  D:  07/06/2004  T:  07/07/2004  Job:  [id number]

## 2011-08-10 ENCOUNTER — Emergency Department (HOSPITAL_COMMUNITY)

## 2011-08-10 ENCOUNTER — Inpatient Hospital Stay (HOSPITAL_COMMUNITY): Admission: EM | Admit: 2011-08-10 | Discharge: 2011-08-13 | DRG: 247 | Attending: Cardiology | Admitting: Cardiology

## 2011-08-10 DIAGNOSIS — Z79899 Other long term (current) drug therapy: Secondary | ICD-10-CM

## 2011-08-10 DIAGNOSIS — I214 Non-ST elevation (NSTEMI) myocardial infarction: Principal | ICD-10-CM | POA: Diagnosis present

## 2011-08-10 DIAGNOSIS — E7800 Pure hypercholesterolemia, unspecified: Secondary | ICD-10-CM | POA: Diagnosis present

## 2011-08-10 DIAGNOSIS — I2510 Atherosclerotic heart disease of native coronary artery without angina pectoris: Secondary | ICD-10-CM | POA: Diagnosis present

## 2011-08-10 DIAGNOSIS — Z87891 Personal history of nicotine dependence: Secondary | ICD-10-CM

## 2011-08-10 DIAGNOSIS — I252 Old myocardial infarction: Secondary | ICD-10-CM

## 2011-08-10 DIAGNOSIS — Y831 Surgical operation with implant of artificial internal device as the cause of abnormal reaction of the patient, or of later complication, without mention of misadventure at the time of the procedure: Secondary | ICD-10-CM | POA: Diagnosis present

## 2011-08-10 DIAGNOSIS — Z21 Asymptomatic human immunodeficiency virus [HIV] infection status: Secondary | ICD-10-CM | POA: Diagnosis present

## 2011-08-10 DIAGNOSIS — Z7982 Long term (current) use of aspirin: Secondary | ICD-10-CM

## 2011-08-10 DIAGNOSIS — T82897A Other specified complication of cardiac prosthetic devices, implants and grafts, initial encounter: Secondary | ICD-10-CM | POA: Diagnosis present

## 2011-08-10 DIAGNOSIS — I10 Essential (primary) hypertension: Secondary | ICD-10-CM | POA: Diagnosis present

## 2011-08-10 LAB — DIFFERENTIAL
Basophils Relative: 0 % (ref 0–1)
Lymphs Abs: 3.2 10*3/uL (ref 0.7–4.0)
Monocytes Absolute: 0.7 10*3/uL (ref 0.1–1.0)
Monocytes Relative: 5 % (ref 3–12)
Neutro Abs: 11.3 10*3/uL — ABNORMAL HIGH (ref 1.7–7.7)

## 2011-08-10 LAB — POCT I-STAT, CHEM 8
Calcium, Ion: 1.18 mmol/L (ref 1.12–1.32)
Chloride: 108 mEq/L (ref 96–112)
Creatinine, Ser: 0.9 mg/dL (ref 0.50–1.35)
Glucose, Bld: 121 mg/dL — ABNORMAL HIGH (ref 70–99)
HCT: 44 % (ref 39.0–52.0)
Hemoglobin: 15 g/dL (ref 13.0–17.0)

## 2011-08-10 LAB — COMPREHENSIVE METABOLIC PANEL
ALT: 15 U/L (ref 0–53)
AST: 17 U/L (ref 0–37)
Albumin: 4 g/dL (ref 3.5–5.2)
Calcium: 10 mg/dL (ref 8.4–10.5)
Creatinine, Ser: 0.71 mg/dL (ref 0.50–1.35)
Sodium: 138 mEq/L (ref 135–145)
Total Protein: 8.1 g/dL (ref 6.0–8.3)

## 2011-08-10 LAB — APTT: aPTT: 31 seconds (ref 24–37)

## 2011-08-10 LAB — CBC
Hemoglobin: 14.1 g/dL (ref 13.0–17.0)
MCH: 28.8 pg (ref 26.0–34.0)
MCHC: 34.6 g/dL (ref 30.0–36.0)
MCV: 83.1 fL (ref 78.0–100.0)
RBC: 4.9 MIL/uL (ref 4.22–5.81)

## 2011-08-10 LAB — PROTIME-INR
INR: 0.97 (ref 0.00–1.49)
Prothrombin Time: 13.1 seconds (ref 11.6–15.2)

## 2011-08-10 LAB — GLUCOSE, CAPILLARY: Glucose-Capillary: 111 mg/dL — ABNORMAL HIGH (ref 70–99)

## 2011-08-10 LAB — MRSA PCR SCREENING: MRSA by PCR: NEGATIVE

## 2011-08-10 LAB — CK TOTAL AND CKMB (NOT AT ARMC): Total CK: 132 U/L (ref 7–232)

## 2011-08-11 ENCOUNTER — Other Ambulatory Visit (HOSPITAL_COMMUNITY)

## 2011-08-11 ENCOUNTER — Inpatient Hospital Stay (HOSPITAL_COMMUNITY)

## 2011-08-11 LAB — BASIC METABOLIC PANEL
Chloride: 102 mEq/L (ref 96–112)
Creatinine, Ser: 0.75 mg/dL (ref 0.50–1.35)
GFR calc Af Amer: >60 mL/min (ref >60)
Potassium: 3.6 mEq/L (ref 3.5–5.1)
Sodium: 137 mEq/L (ref 135–145)

## 2011-08-11 LAB — CARDIAC PANEL(CRET KIN+CKTOT+MB+TROPI)
CK, MB: 28 ng/mL (ref 0.3–4.0)
CK, MB: 62 ng/mL (ref 0.3–4.0)
Relative Index: 7.3 — ABNORMAL HIGH (ref 0.0–2.5)
Total CK: 797 U/L — ABNORMAL HIGH (ref 7–232)
Troponin I: 2.35 ng/mL (ref <0.30)
Troponin I: 15.44 ng/mL (ref <0.30)

## 2011-08-11 LAB — TROPONIN I: Troponin I: 15.2 ng/mL (ref <0.30)

## 2011-08-11 LAB — CBC
Hemoglobin: 13.3 g/dL (ref 13.0–17.0)
MCH: 28.1 pg (ref 26.0–34.0)
Platelets: 258 10*3/uL (ref 150–400)
RBC: 4.73 MIL/uL (ref 4.22–5.81)
WBC: 13.8 10*3/uL — ABNORMAL HIGH (ref 4.0–10.5)

## 2011-08-11 LAB — HEPARIN LEVEL (UNFRACTIONATED): Heparin Unfractionated: 0.16 IU/mL — ABNORMAL LOW (ref 0.30–0.70)

## 2011-08-11 LAB — LIPID PANEL
Total CHOL/HDL Ratio: 6.4 RATIO
VLDL: 48 mg/dL — ABNORMAL HIGH (ref 0–40)

## 2011-08-11 LAB — GLUCOSE, CAPILLARY
Glucose-Capillary: 135 mg/dL — ABNORMAL HIGH (ref 70–99)
Glucose-Capillary: 115 mg/dL — ABNORMAL HIGH (ref 70–99)

## 2011-08-11 LAB — POCT ACTIVATED CLOTTING TIME: Activated Clotting Time: 160 seconds

## 2011-08-12 LAB — CBC
Hemoglobin: 12.7 g/dL — ABNORMAL LOW (ref 13.0–17.0)
MCH: 28.1 pg (ref 26.0–34.0)
MCV: 84.5 fL (ref 78.0–100.0)
RBC: 4.52 MIL/uL (ref 4.22–5.81)

## 2011-08-12 LAB — CARDIAC PANEL(CRET KIN+CKTOT+MB+TROPI): Relative Index: 4 — ABNORMAL HIGH (ref 0.0–2.5)

## 2011-08-12 LAB — BASIC METABOLIC PANEL
BUN: 9 mg/dL (ref 6–23)
CO2: 24 mEq/L (ref 19–32)
Chloride: 108 mEq/L (ref 96–112)
Creatinine, Ser: 0.74 mg/dL (ref 0.50–1.35)

## 2011-08-13 LAB — CARDIAC PANEL(CRET KIN+CKTOT+MB+TROPI)
Relative Index: 2.8 — ABNORMAL HIGH (ref 0.0–2.5)
Troponin I: 5.32 ng/mL (ref <0.30)

## 2011-08-13 LAB — CBC
HCT: 39.2 % (ref 39.0–52.0)
Hemoglobin: 12.8 g/dL — ABNORMAL LOW (ref 13.0–17.0)
RBC: 4.63 MIL/uL (ref 4.22–5.81)
WBC: 8.7 10*3/uL (ref 4.0–10.5)

## 2011-08-13 LAB — BASIC METABOLIC PANEL
BUN: 10 mg/dL (ref 6–23)
Chloride: 104 mEq/L (ref 96–112)
GFR calc Af Amer: >60 mL/min (ref >60)
Glucose, Bld: 93 mg/dL (ref 70–99)
Potassium: 3.9 mEq/L (ref 3.5–5.1)

## 2011-08-16 NOTE — Cardiovascular Report (Signed)
Douglas Salazar, AZZARA NO.:  [id number]  MEDICAL RECORD NO.:  [id number]  LOCATION:  2909                         FACILITY:  [facility]  PHYSICIAN:  Eduardo Osier. Sharyn Lull, M.D. DATE OF BIRTH:  [birth year]  DATE OF PROCEDURE:  08/11/2011 DATE OF DISCHARGE:                           CARDIAC CATHETERIZATION   PROCEDURES: 1. Left cardiac catheterization with selective left and right coronary     angiography, left ventriculography graft via right groin using     Judkins technique. 2. Successful percutaneous transluminal coronary angioplasty to distal     right coronary artery using 2.0 x 12 mm long mini Trek balloon. 3. Successful deployment of 2.5 x 28 mm long Promus Element drug-     eluting stent in distal right coronary artery. 4. Successful postdilatation of this stent using 2.75 x 20 mm long [place]     Trek balloon.  OPERATOR:  Isebella Upshur N. Sharyn Lull, MD  INDICATIONS FOR PROCEDURE:  Mr. Coufal is a 40 year old black male with past medical history significant for coronary artery disease, history of inferoposterior wall MI in April 2005 status post PCI to distal RCA, hypertension, HIV plus history of cocaine and tobacco abuse in the past. He came to the ER complaining of retrosternal and right-sided chest pain radiating to right arm associated with nausea, diaphoresis, and shortness of breath since this morning off and on.  He states he gets exertional chest pain with minimal exertion and relieves with rest.  He states chest pain feels similar in nature when he had MI in 2005.  He states occasionally chest pain increases with breathing also.  Denies any cough or fever or chills.  Denies any palpitation, lightheadedness, or syncope.  Denies PND, orthopnea, or leg swelling.  PAST MEDICAL HISTORY:  As above.  PAST SURGICAL HISTORY:  He had PTCA stenting to distal RCA in 2005 as above.  SOCIAL HISTORY:  Single, no children.  Smoked half pack per day for 10- 15 years.  No history  of alcohol abuse.  Used to smoke weeds and crack cocaine.  Last abuse was 3 plus years ago.  FAMILY HISTORY:  Positive for coronary artery disease.  PHYSICAL EXAMINATION:  GENERAL:  He was alert, awake, and oriented x3, in no acute distress. VITAL SIGNS:  Blood pressure was 129/98.  Pulse was 69.  Afebrile. HEENT:  Conjunctivae were pink. NECK:  Supple.  No JVD, no bruit. LUNGS:  Clear to auscultation without rhonchi or rales. CARDIOVASCULAR:  S1 and S2 were normal.  There was soft systolic murmur. ABDOMEN:  Soft.  Bowel sounds present.  Nontender. EXTREMITIES:  There is no clubbing, cyanosis, or edema.  LABORATORY DATA:  CK total was 132, MB 4.1, and troponin I was 0.02. His EKG showed normal sinus rhythm with old inferoposterolateral wall MI with minimal ST elevation which were same as compared with prior EKG. There were no new acute ischemic changes.  Initially, the patient was admitted to Step-Down Unit.  The patient continued to have recurrent chest pain and was started on IV nitrates, heparin, and Integrilin and received 60 mg of prasugrel.  Despite that, the patient continued to have recurrent chest pain and second set of cardiac enzymes were noted  to be positive.  Due to chest pain and elevated cardiac enzymes, I discussed with the patient regarding emergency left cath, possible PTCA and stenting, its risks and benefits, i.e., death, MI, stroke, need for emergency CABG, local vascular complications, etc., and consented for the procedure.  PROCEDURE:  After obtaining informed consent, the patient was brought to the Cath Lab and was placed on fluoroscopy table.  The right groin was prepped and draped in the usual fashion.  Xylocaine 1% was used for local anesthesia in the right groin.  With the help of thin-wall needle, a 6-French arterial sheath was placed.  The sheath was aspirated and flushed.  Next, a 6-French left Judkins catheter was advanced over the wire under  fluoroscopic guidance up to the ascending aorta.  Wire was pulled out.  The catheter was aspirated and connected to the manifold. Catheter was further advanced and engaged into left coronary ostium. Multiple views of the left system were taken.  Next, the catheter was disengaged and was pulled out over the wire and was replaced with 6- [place] right Judkins catheter, which was advanced over the wire under fluoroscopic guidance up to the ascending aorta.  Wire was pulled out. The catheter was aspirated and connected to the manifold.  Catheter was further advanced and engaged into right coronary ostium.  Multiple views of the right coronary artery were obtained.  Next, the catheter was disengaged and was pulled out over the wire and was replaced with 6- French pigtail catheter at the end of the procedure which was advanced over the wire under fluoroscopic guidance up to the ascending aorta. Wire was pulled out.  The catheter was aspirated and connected to the manifold.  Catheter was further advanced across the aortic valve into the LV.  LV pressures were recorded.  Next, LV-graphy was done in 30- degree RAO position.  Post-angiographic pressures were recorded from LV and then pullback pressures were recorded from aorta.  There was no gradient across the aortic valve.  Next, the pigtail catheter was pulled out over the wire.  Sheaths were aspirated and flushed.  FINDINGS:  LV showed good LV systolic function.  There was mild inferobasal wall hypokinesia, EF of 50-55%.  Left main was patent.  LAD has 30-35% proximal stenosis.  Diagonal 1 and 2 were very small. Diagonal 3 was small, which was patent.  Ramus has 30-40% ostial stenosis.  Left circumflex has 50-60% proximal napkin ring stenosis, which appears to be more significant in LAO caudal view.  The left circumflex is large and then it tapers down in AV groove.  OM-1 and OM-2 are very, very small.  OM-3 is large which is patent.  RCA has  20-25% proximal and 10-15% mid stenosis and 70% diffuse in-stent and 95% at the edge distal restenosis due to new atherosclerosis with TIMI grade 3 distal flow.  INTERVENTIONAL PROCEDURE:  Successful PTCA to distal RCA was done using 2.0 x 15 mm long mini Trek balloon for predilatation and then 2.5 x 28 mm long Promus Element drug-eluting stent was deployed at 10 atmospheric pressure in distal RCA going to PLV branch.  Stent was postdilated using 2.75 x 20 mm long [place] Trek balloon going up to 13 and 18 atmospheric pressure.  Lesion was dilated from 70-95% to 0% residual with excellent TIMI grade 3 distal flow without evidence of dissection or distal embolization.  The patient received weight-based Angiomax and 60 mg of Effient prior to the procedure and was continued on Integrilin during  the procedure.  The patient tolerated the procedure well.  There were no complications.  The patient was transferred to the recovery room in stable condition.     Eduardo Osier. Sharyn Lull, M.D.     MNH/MEDQ  D:  08/11/2011  T:  08/11/2011  Job:  [id number]  Electronically Signed by Rinaldo Cloud M.D. on 08/16/2011 08:17:51 PM

## 2011-08-16 NOTE — Discharge Summary (Signed)
Douglas Salazar, Douglas Salazar NO.:  [id number]  MEDICAL RECORD NO.:  [id number]  LOCATION:  2919                         FACILITY:  [facility]  PHYSICIAN:  Eduardo Osier. Sharyn Lull, M.D. DATE OF BIRTH:  10-11-1971  DATE OF ADMISSION:  08/10/2011 DATE OF DISCHARGE:  08/13/2011                              DISCHARGE SUMMARY   ADMITTING DIAGNOSES: 1. Chest pain, rule out myocardial infarction. 2. Coronary artery disease. 3. History of inferoposterolateral wall myocardial infarction in the     past. 4. Hypertension. 5. Hypercholesteremia. 6. Human immunodeficiency virus. 7. History of tobacco abuse. 8. History of polysubstance abuse. 9. Positive family history of coronary artery disease.  DISCHARGE DIAGNOSES: 1. Status post non-Q-wave myocardial infarction, status post left cath     and percutaneous transluminal coronary angioplasty stenting to     distal right coronary artery. 2. Coronary artery disease. 3. History of inferoposterolateral wall myocardial infarction in the     past. 4. Hypertension. 5. Hypercholesteremia. 6. Human immunodeficiency virus. 7. History of tobacco abuse. 8. History of polysubstance abuse. 9. Positive family history of coronary artery disease.  DISCHARGED HOME MEDICATIONS: 1. Enteric-coated aspirin 325 mg 1 tablet daily. 2. Metoprolol 12.5 mg twice daily. 3. Nitrostat 0.4 mg sublingual use as directed. 4. Prasugrel 10 mg 1 tablet daily. 5. Crestor 20 mg 1 tablet daily. 6. Gabapentin 600 mg 1 tablet daily as before. 7. Norvir 100 mg 1 tablet daily as before. 8. Reyataz 300 mg 1 tablet daily as before. 9. Truvada 300 mg/200 mg 1 tablet daily as before.  DIET:  Low salt, low cholesterol.  ACTIVITY:  Increase activity slowly as tolerated.  FOLLOWUP:  Follow up with me as soon as possible when released from prison. Follow up with [facility] physician in 1 week.  CONDITION AT DISCHARGE:  Stable.  BRIEF HISTORY AND HOSPITAL COURSE:  [name] is a 40 year old black male with past medical history significant for coronary artery disease, history of inferoposterolateral wall MI in April 2005 status post PCI to distal RCA, PCI to 100% occluded distal RCA in the past, hypertension, HIV positive, history of cocaine and tobacco abuse who came to the ER complaining of retrosternal and right-sided chest pain radiating to the right arm associated with nausea and diaphoresis and shortness of breath since this morning off and on.  He states he gets exertional chest pain with minimal exertion and relieves with rest.  He states chest pain feels similar in nature when he had MI in 2005.  He states occasionally chest pain increases with deep breathing.  Denies any cough, fever, or chills.  Denies palpitation, lightheadedness, or syncope.  Denies PND, orthopnea, or leg swelling.  PAST MEDICAL HISTORY:  As above.  PAST SURGICAL HISTORY:  He had a PTCA and stenting to 100% distal occluded distal RCA in 2005.  SOCIAL HISTORY:  He is single, no children.  He smoked half a pack per day for 10-15 years.  No history of alcohol abuse.  Used to smoke weeds and cracks.  Last abuse was 3 plus years ago.  FAMILY HISTORY:  Father died of MI at the age of 38.  Mother is alive, she is 64.  He  has 3 aunts, they had MI at young age.  ALLERGIES:  He is allergic to SULFA and NAPROSYN.  MEDICATION:  No cardiac medications except aspirin and he is taking medications for HIV as above.  PHYSICAL EXAMINATION:  GENERAL:  He was alert, awake, and oriented x3, in no acute distress. VITAL SIGNS:  Blood pressure was 129/98.  Pulse was 69.  Afebrile. HEENT:  Conjunctivae were pink. NECK:  Supple.  No JVD, no bruit. LUNGS:  Clear to auscultation without rhonchi or rales. CARDIOVASCULAR:  S1 and S2 was normal.  There was soft systolic murmur. ABDOMEN:  Soft.  Bowel sounds present.  Nontender. EXTREMITIES:  There is no clubbing, cyanosis, or  edema.  LABORATORY DATA:  His hemoglobin was 14.1, hematocrit 40.7, and white count of 15.2.  His point-of-care marker, Troponin I was 0.02, CK-MB by lab was 132, MB 4.1, relative index 3.1.  His sodium was 138, potassium 3.9, BUN 16, creatinine 0.71, glucose was 97.  His cholesterol was 172, triglycerides 238, LDL 97, and HDL was low 27.  His next set of cardiac enzymes, CK-MB was 28, troponin I was 2.35.  Next set CK was 266, MB 10.7, troponin I is trending down to 6.97.  Today's CK is 156, MB 4.4, troponin I is 5.32.  Potassium is 3.9, BUN 10, creatinine is 0.73, glucose 97, hemoglobin is 12.8, hematocrit 39.2, white count of 8.7.  BRIEF HOSPITAL COURSE:  The patient was admitted to Step-Down Unit.  The patient ruled in for non-Q-wave myocardial infarction.  The patient subsequently underwent left cardiac cath with selective left and right coronary angiography, LV-graphy, and PTCA stenting to distal RCA as per procedure report.  The patient tolerated the procedure well.  There were no complications.  Postprocedure, the patient did not have any anginal chest pain and his groin is stable with no evidence of hematoma or bruit.  Phase I cardiac rehab was called.  The patient has been ambulating without any problems.  His groin is stable.  The patient will be discharged back to prison and will be followed up in my office once he is discharged from the prison.  We will discontinue his Altace as the patient's blood pressure remains in 90s- low 100s.  The patient will also be scheduled for phase II cardiac rehab with [facility].     Eduardo Osier. Sharyn Lull, M.D.     [facility]  D:  08/13/2011  T:  08/14/2011  Job:  [id number]  Electronically Signed by Rinaldo Cloud M.D. on 08/16/2011 08:17:57 PM

## 2011-10-10 LAB — DIFFERENTIAL
Basophils Relative: 0
Eosinophils Absolute: 0
Eosinophils Relative: 0
Monocytes Absolute: 0.7
Monocytes Relative: 11
Neutrophils Relative %: 63

## 2011-10-10 LAB — BASIC METABOLIC PANEL
CO2: 24
Chloride: 109
Creatinine, Ser: 0.75
GFR calc Af Amer: >60

## 2011-10-10 LAB — CBC
HCT: 43.8
MCHC: 34
MCV: 84
RBC: 5.21

## 2012-05-06 ENCOUNTER — Other Ambulatory Visit: Payer: Self-pay | Admitting: Licensed Clinical Social Worker

## 2012-05-06 DIAGNOSIS — B20 Human immunodeficiency virus [HIV] disease: Secondary | ICD-10-CM

## 2012-05-07 ENCOUNTER — Other Ambulatory Visit (INDEPENDENT_AMBULATORY_CARE_PROVIDER_SITE_OTHER): Payer: Self-pay

## 2012-05-07 DIAGNOSIS — Z113 Encounter for screening for infections with a predominantly sexual mode of transmission: Secondary | ICD-10-CM

## 2012-05-07 DIAGNOSIS — B20 Human immunodeficiency virus [HIV] disease: Secondary | ICD-10-CM

## 2012-05-07 LAB — COMPLETE METABOLIC PANEL WITH GFR
ALT: 26 U/L (ref 0–53)
Alkaline Phosphatase: 143 U/L — ABNORMAL HIGH (ref 39–117)
Creat: 0.96 mg/dL (ref 0.50–1.35)
GFR, Est Non African American: >89 mL/min
Sodium: 143 mEq/L (ref 135–145)
Total Bilirubin: 0.7 mg/dL (ref 0.3–1.2)
Total Protein: 7.3 g/dL (ref 6.0–8.3)

## 2012-05-07 LAB — CBC WITH DIFFERENTIAL/PLATELET
Basophils Absolute: 0 10*3/uL (ref 0.0–0.1)
Basophils Relative: 0 % (ref 0–1)
Eosinophils Absolute: 0.1 10*3/uL (ref 0.0–0.7)
Eosinophils Relative: 1 % (ref 0–5)
MCH: 27.2 pg (ref 26.0–34.0)
MCHC: 31.7 g/dL (ref 30.0–36.0)
Neutrophils Relative %: 59 % (ref 43–77)
Platelets: 246 10*3/uL (ref 150–400)
RBC: 5.07 MIL/uL (ref 4.22–5.81)
RDW: 14.8 % (ref 11.5–15.5)

## 2012-05-07 LAB — RPR

## 2012-05-08 LAB — HIV-1 RNA QUANT-NO REFLEX-BLD: HIV-1 RNA Quant, Log: <1.3 {Log} (ref <1.30)

## 2012-05-08 LAB — T-HELPER CELL (CD4) - (RCID CLINIC ONLY): CD4 T Cell Abs: 920 uL (ref 400–2700)

## 2012-05-15 ENCOUNTER — Ambulatory Visit: Payer: Self-pay

## 2012-05-19 ENCOUNTER — Ambulatory Visit (INDEPENDENT_AMBULATORY_CARE_PROVIDER_SITE_OTHER): Payer: Self-pay | Admitting: Infectious Disease

## 2012-05-19 ENCOUNTER — Encounter: Payer: Self-pay | Admitting: Infectious Disease

## 2012-05-19 ENCOUNTER — Ambulatory Visit: Payer: Self-pay

## 2012-05-19 VITALS — BP 121/82 | HR 62 | Temp 97.5°F | Ht 72.0 in | Wt 248.0 lb

## 2012-05-19 DIAGNOSIS — E349 Endocrine disorder, unspecified: Secondary | ICD-10-CM

## 2012-05-19 DIAGNOSIS — IMO0001 Reserved for inherently not codable concepts without codable children: Secondary | ICD-10-CM | POA: Insufficient documentation

## 2012-05-19 DIAGNOSIS — I2510 Atherosclerotic heart disease of native coronary artery without angina pectoris: Secondary | ICD-10-CM

## 2012-05-19 DIAGNOSIS — F640 Transsexualism: Secondary | ICD-10-CM

## 2012-05-19 DIAGNOSIS — B20 Human immunodeficiency virus [HIV] disease: Secondary | ICD-10-CM

## 2012-05-19 DIAGNOSIS — G609 Hereditary and idiopathic neuropathy, unspecified: Secondary | ICD-10-CM

## 2012-05-19 MED ORDER — SPIRONOLACTONE 25 MG PO TABS: 25.0000 mg | ORAL_TABLET | Freq: Every day | ORAL | Status: DC

## 2012-05-19 MED ORDER — GABAPENTIN (ONCE-DAILY) 600 MG PO TABS: 600.0000 mg | ORAL_TABLET | Freq: Every day | ORAL | Status: DC

## 2012-05-19 MED ORDER — OXYCODONE-ACETAMINOPHEN 5-325 MG PO TABS
1.0000 | ORAL_TABLET | Freq: Three times a day (TID) | ORAL | Status: DC | PRN
Start: 2012-05-19 — End: 2014-08-16

## 2012-05-19 NOTE — Assessment & Plan Note (Signed)
Needs to be on asa, plavix (or prasugrel) beta blocker and statin

## 2012-05-19 NOTE — Progress Notes (Signed)
  Subjective:    Patient ID: Douglas Salazar, male    DOB: [birth year], 41 y.o.   MRN: [id number]  HPI  41 year old transgender male with HIV who also has comorbid CAD who was recently incarcerated for assaulting   A Emergency planning/management officer. She is currently perfectly suppressed  With undetectable viral load and healthy cd4 count. She had admission this fall for MI and is followed by Dr. Sharyn Lull as well. She is requesting aldactone and estrogen to assist with her transgender transformation so that she can marry her "husband" who is in jail still. She is supposed to be on several other drugs including beta blocker, statin and plavix but does nto know what she is taking besides the gabapentin and percocet. I spent greater than 60 minutes with the patient including greater than 50% of time in face to face counsel of the patient and in coordination of their care.    Review of Systems  Constitutional: Negative for fever, chills, diaphoresis, activity change, appetite change, fatigue and unexpected weight change.  HENT: Negative for congestion, sore throat, rhinorrhea, sneezing, trouble swallowing and sinus pressure.   Eyes: Negative for photophobia and visual disturbance.  Respiratory: Negative for cough, chest tightness, shortness of breath, wheezing and stridor.   Cardiovascular: Negative for chest pain, palpitations and leg swelling.  Gastrointestinal: Negative for nausea, vomiting, abdominal pain, diarrhea, constipation, blood in stool, abdominal distention and anal bleeding.  Genitourinary: Negative for dysuria, hematuria, flank pain and difficulty urinating.  Musculoskeletal: Negative for myalgias, back pain, joint swelling, arthralgias and gait problem.  Skin: Negative for color change, pallor, rash and wound.  Neurological: Negative for dizziness, tremors, weakness and light-headedness.  Hematological: Negative for adenopathy. Does not bruise/bleed easily.  Psychiatric/Behavioral: Negative for  behavioral problems, confusion, sleep disturbance, dysphoric mood, decreased concentration and agitation.       Objective:   Physical Exam  Constitutional: He is oriented to person, place, and time. He appears well-developed and well-nourished. No distress.  HENT:  Head: Normocephalic and atraumatic.  Mouth/Throat: Oropharynx is clear and moist. No oropharyngeal exudate.  Eyes: Conjunctivae and EOM are normal. Pupils are equal, round, and reactive to light. No scleral icterus.  Neck: Normal range of motion. Neck supple. No JVD present.  Cardiovascular: Normal rate, regular rhythm and normal heart sounds.  Exam reveals no gallop and no friction rub.   No murmur heard. Pulmonary/Chest: Effort normal and breath sounds normal. No respiratory distress. He has no wheezes. He has no rales. He exhibits no tenderness.  Abdominal: He exhibits no distension and no mass. There is no tenderness. There is no rebound and no guarding.  Musculoskeletal: He exhibits no edema and no tenderness.  Lymphadenopathy:    He has no cervical adenopathy.  Neurological: He is alert and oriented to person, place, and time. He has normal reflexes. He exhibits normal muscle tone. Coordination normal.  Skin: Skin is warm and dry. He is not diaphoretic. No erythema. No pallor.  Psychiatric: He has a normal mood and affect. His behavior is normal. Judgment and thought content normal.          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 Will consider estrogen rx but am not comfortable at this time given concern for risk for MI

## 2012-05-19 NOTE — Patient Instructions (Signed)
Please bring all of your medicines with you to your next appointment

## 2012-05-19 NOTE — Assessment & Plan Note (Signed)
Continue reyataz, norvir and truvada 

## 2012-05-19 NOTE — Assessment & Plan Note (Signed)
Will start aldactone for anti-androgen effect. Will consider estrogen rx but am not comfortable at this time given concern for risk for MI

## 2012-05-20 ENCOUNTER — Other Ambulatory Visit: Payer: Self-pay | Admitting: *Deleted

## 2012-05-20 DIAGNOSIS — B20 Human immunodeficiency virus [HIV] disease: Secondary | ICD-10-CM

## 2012-05-20 MED ORDER — RITONAVIR 100 MG PO TABS: 100.0000 mg | ORAL_TABLET | Freq: Every day | ORAL | Status: DC

## 2012-05-20 MED ORDER — ATAZANAVIR SULFATE 300 MG PO CAPS: 300.0000 mg | ORAL_CAPSULE | Freq: Every day | ORAL | Status: DC

## 2012-05-20 MED ORDER — EMTRICITABINE-TENOFOVIR DF 200-300 MG PO TABS: 1.0000 | ORAL_TABLET | Freq: Every day | ORAL | Status: DC

## 2012-05-20 NOTE — Telephone Encounter (Signed)
Printed scripts to send with ADAP application

## 2012-05-29 ENCOUNTER — Ambulatory Visit: Payer: Self-pay | Admitting: Internal Medicine

## 2012-06-02 ENCOUNTER — Other Ambulatory Visit: Payer: Self-pay | Admitting: *Deleted

## 2012-06-02 DIAGNOSIS — B20 Human immunodeficiency virus [HIV] disease: Secondary | ICD-10-CM

## 2012-06-02 DIAGNOSIS — G609 Hereditary and idiopathic neuropathy, unspecified: Secondary | ICD-10-CM

## 2012-06-02 MED ORDER — EMTRICITABINE-TENOFOVIR DF 200-300 MG PO TABS: 1.0000 | ORAL_TABLET | Freq: Every day | ORAL | Status: DC

## 2012-06-02 MED ORDER — RITONAVIR 100 MG PO TABS: 100.0000 mg | ORAL_TABLET | Freq: Every day | ORAL | Status: DC

## 2012-06-02 MED ORDER — GABAPENTIN (ONCE-DAILY) 600 MG PO TABS: 600.0000 mg | ORAL_TABLET | Freq: Every day | ORAL | Status: DC

## 2012-06-02 MED ORDER — ATAZANAVIR SULFATE 300 MG PO CAPS: 300.0000 mg | ORAL_CAPSULE | Freq: Every day | ORAL | Status: DC

## 2012-06-16 ENCOUNTER — Telehealth: Payer: Self-pay | Admitting: *Deleted

## 2012-06-16 ENCOUNTER — Telehealth: Payer: Self-pay | Admitting: Infectious Disease

## 2012-06-16 DIAGNOSIS — G609 Hereditary and idiopathic neuropathy, unspecified: Secondary | ICD-10-CM

## 2012-06-16 MED ORDER — GABAPENTIN (ONCE-DAILY) 600 MG PO TABS: 600.0000 mg | ORAL_TABLET | Freq: Three times a day (TID) | ORAL | Status: DC

## 2012-06-16 NOTE — Telephone Encounter (Signed)
Patient called stating that he takes the gabapentin 3 x daily and it is written for once at bedtime.  He is requesting that the Rx be changed.  Please advise Wendall Mola CMA

## 2012-06-16 NOTE — Telephone Encounter (Signed)
That is fine 

## 2012-06-16 NOTE — Telephone Encounter (Signed)
Pt had gone up to taking this tid and requested higher rx dose

## 2012-06-17 ENCOUNTER — Other Ambulatory Visit: Payer: Self-pay | Admitting: *Deleted

## 2012-06-17 DIAGNOSIS — G609 Hereditary and idiopathic neuropathy, unspecified: Secondary | ICD-10-CM

## 2012-06-20 ENCOUNTER — Telehealth: Payer: Self-pay | Admitting: *Deleted

## 2012-06-20 ENCOUNTER — Other Ambulatory Visit: Payer: Self-pay | Admitting: *Deleted

## 2012-06-20 DIAGNOSIS — R52 Pain, unspecified: Secondary | ICD-10-CM

## 2012-06-20 DIAGNOSIS — I2510 Atherosclerotic heart disease of native coronary artery without angina pectoris: Secondary | ICD-10-CM

## 2012-06-20 MED ORDER — SPIRONOLACTONE 25 MG PO TABS: 25.0000 mg | ORAL_TABLET | Freq: Every day | ORAL | Status: DC

## 2012-06-20 MED ORDER — OXYCODONE-ACETAMINOPHEN 5-325 MG PO TABS: 1.0000 | ORAL_TABLET | Freq: Three times a day (TID) | ORAL | Status: DC | PRN

## 2012-06-20 NOTE — Telephone Encounter (Signed)
I spoke with the pt. He will come here after 1:30 next Monday to get the rx.  He is going to get his labs done when he is here so he will not have to drive here twice from [place] ( his app tis 06/25/12)

## 2012-06-20 NOTE — Telephone Encounter (Signed)
I talked with Dr. Daiva Eves who said he can have the refill next week when someone can sign it

## 2012-06-20 NOTE — Telephone Encounter (Signed)
He also wanted percocet. He is currently in [place], [place]. Phone [phone]. I told him that is a med that the doctor has to sign & has to be picked up in person. I told him I will send a message to md as he is not due back here until next Thursday.  We have no other mds here today to sign it.  Pt has no insurance and states he cannot wait for the med  To md. Ok to fill? I will ask Dr. Orvan Falconer to sign Monday if ok

## 2012-06-23 ENCOUNTER — Other Ambulatory Visit: Payer: Self-pay | Admitting: *Deleted

## 2012-06-23 ENCOUNTER — Telehealth: Payer: Self-pay | Admitting: *Deleted

## 2012-06-23 ENCOUNTER — Other Ambulatory Visit (INDEPENDENT_AMBULATORY_CARE_PROVIDER_SITE_OTHER): Payer: Self-pay

## 2012-06-23 DIAGNOSIS — R52 Pain, unspecified: Secondary | ICD-10-CM

## 2012-06-23 DIAGNOSIS — B20 Human immunodeficiency virus [HIV] disease: Secondary | ICD-10-CM

## 2012-06-23 LAB — COMPLETE METABOLIC PANEL WITH GFR
ALT: 24 U/L (ref 0–53)
BUN: 13 mg/dL (ref 6–23)
CO2: 24 mEq/L (ref 19–32)
Calcium: 9.1 mg/dL (ref 8.4–10.5)
Chloride: 107 mEq/L (ref 96–112)
Creat: 0.85 mg/dL (ref 0.50–1.35)
GFR, Est African American: >89 mL/min
Total Bilirubin: 0.5 mg/dL (ref 0.3–1.2)

## 2012-06-23 LAB — CBC WITH DIFFERENTIAL/PLATELET
Eosinophils Absolute: 0.1 10*3/uL (ref 0.0–0.7)
Eosinophils Relative: 1 % (ref 0–5)
Hemoglobin: 14.1 g/dL (ref 13.0–17.0)
Lymphs Abs: 4.4 10*3/uL — ABNORMAL HIGH (ref 0.7–4.0)
MCH: 27.7 pg (ref 26.0–34.0)
MCHC: 34.3 g/dL (ref 30.0–36.0)
MCV: 80.7 fL (ref 78.0–100.0)
Monocytes Absolute: 0.8 10*3/uL (ref 0.1–1.0)
Monocytes Relative: 7 % (ref 3–12)
RBC: 5.09 MIL/uL (ref 4.22–5.81)

## 2012-06-23 MED ORDER — OXYCODONE-ACETAMINOPHEN 5-325 MG PO TABS: 1.0000 | ORAL_TABLET | Freq: Three times a day (TID) | ORAL | Status: DC | PRN

## 2012-06-23 NOTE — Telephone Encounter (Signed)
Rx for percocet printed multiple times, due to my printer not working.  This was reported to the IT department. Wendall Mola CMA

## 2012-06-23 NOTE — Telephone Encounter (Signed)
Needed to reprint due to pt coming today and Dr. Orvan Falconer the only MD here today.

## 2012-06-24 LAB — HIV-1 RNA QUANT-NO REFLEX-BLD
HIV 1 RNA Quant: <20 copies/mL (ref <20)
HIV-1 RNA Quant, Log: <1.3 {Log} (ref <1.30)

## 2012-06-25 ENCOUNTER — Other Ambulatory Visit: Payer: Self-pay

## 2012-07-09 ENCOUNTER — Ambulatory Visit: Payer: Self-pay

## 2012-07-09 ENCOUNTER — Encounter: Payer: Self-pay | Admitting: Infectious Disease

## 2012-07-09 ENCOUNTER — Ambulatory Visit (INDEPENDENT_AMBULATORY_CARE_PROVIDER_SITE_OTHER): Payer: Self-pay | Admitting: Infectious Disease

## 2012-07-09 VITALS — BP 113/74 | HR 71 | Temp 97.8°F | Wt 235.0 lb

## 2012-07-09 DIAGNOSIS — F17200 Nicotine dependence, unspecified, uncomplicated: Secondary | ICD-10-CM | POA: Insufficient documentation

## 2012-07-09 DIAGNOSIS — R52 Pain, unspecified: Secondary | ICD-10-CM

## 2012-07-09 DIAGNOSIS — E349 Endocrine disorder, unspecified: Secondary | ICD-10-CM

## 2012-07-09 DIAGNOSIS — I2510 Atherosclerotic heart disease of native coronary artery without angina pectoris: Secondary | ICD-10-CM

## 2012-07-09 DIAGNOSIS — B20 Human immunodeficiency virus [HIV] disease: Secondary | ICD-10-CM

## 2012-07-09 DIAGNOSIS — IMO0001 Reserved for inherently not codable concepts without codable children: Secondary | ICD-10-CM

## 2012-07-09 DIAGNOSIS — E785 Hyperlipidemia, unspecified: Secondary | ICD-10-CM

## 2012-07-09 DIAGNOSIS — F640 Transsexualism: Secondary | ICD-10-CM

## 2012-07-09 DIAGNOSIS — G609 Hereditary and idiopathic neuropathy, unspecified: Secondary | ICD-10-CM

## 2012-07-09 LAB — LIPID PANEL
HDL: 29 mg/dL — ABNORMAL LOW (ref >39)
LDL Cholesterol: 79 mg/dL (ref 0–99)
Total CHOL/HDL Ratio: 5.6 Ratio

## 2012-07-09 MED ORDER — OXYCODONE-ACETAMINOPHEN 5-325 MG PO TABS: 1.0000 | ORAL_TABLET | Freq: Four times a day (QID) | ORAL | Status: DC | PRN

## 2012-07-09 NOTE — Assessment & Plan Note (Signed)
She claims this is not well controlled with her current dose of Percocet so I'm increasing it to 4 tablets a day. She can continue her gabapentin

## 2012-07-09 NOTE — Assessment & Plan Note (Signed)
Will consider Premarin when she stopped smoking

## 2012-07-09 NOTE — Assessment & Plan Note (Signed)
To see Dr. Sharyn Lull in today. Again I would think that she still needs to be on a statin Plavix and a beta blocker. We'll continue the Aldactone.

## 2012-07-09 NOTE — Assessment & Plan Note (Signed)
Check fasting lipid profile today. Again a regimen of Isentress and Truvada would be a  more "lipid friendly"

## 2012-07-09 NOTE — Assessment & Plan Note (Signed)
Counseled to quit 

## 2012-07-09 NOTE — Progress Notes (Signed)
Subjective:    Patient ID: Douglas Salazar, male    DOB: 05/30/1971, 41 y.o.   MRN: [id number]  HPI  41 year old transgender male with HIV who also has comorbid CAD who was recently incarcerated for assaulting A Emergency planning/management officer. She remains perfectly neurologically suppressed on Reyataz Norvir and Truvada. She is going to see Dr. Sharyn Lull today. She is still smoking a pack of cigarettes per week. She is on Aldactone wishes for other therapies to augment her transgender transformation. I informed her that I would not prescribe Premarin her other therapies and is usually stop smoking. She is supposed to be on other therapies to optimize her cardiac health including statin a beta blocker and Plavix. She seems to be unclear of the other medicines that she is supposed to be taking. I spent greater than 45 minutes with the patient including greater than 50% of time in face to face counsel of the patient and in coordination of their care.    Review of Systems  Constitutional: Negative for fever, chills, diaphoresis, activity change, appetite change, fatigue and unexpected weight change.  HENT: Negative for congestion, sore throat, rhinorrhea, sneezing, trouble swallowing and sinus pressure.   Eyes: Negative for photophobia and visual disturbance.  Respiratory: Negative for cough, chest tightness, shortness of breath, wheezing and stridor.   Cardiovascular: Negative for chest pain, palpitations and leg swelling.  Gastrointestinal: Negative for nausea, vomiting, abdominal pain, diarrhea, constipation, blood in stool, abdominal distention and anal bleeding.  Genitourinary: Negative for dysuria, hematuria, flank pain and difficulty urinating.  Musculoskeletal: Negative for myalgias, back pain, joint swelling, arthralgias and gait problem.  Skin: Negative for color change, pallor, rash and wound.  Neurological: Negative for dizziness, tremors, weakness and light-headedness.  Hematological: Negative for  adenopathy. Does not bruise/bleed easily.  Psychiatric/Behavioral: Negative for behavioral problems, confusion, disturbed wake/sleep cycle, dysphoric mood, decreased concentration and agitation.       Objective:   Physical Exam  Constitutional: He is oriented to person, place, and time. He appears well-developed and well-nourished. No distress.  HENT:  Head: Normocephalic and atraumatic.  Mouth/Throat: Oropharynx is clear and moist. No oropharyngeal exudate.  Eyes: Conjunctivae and EOM are normal. Pupils are equal, round, and reactive to light. No scleral icterus.  Neck: Normal range of motion. Neck supple. No JVD present.  Cardiovascular: Normal rate, regular rhythm and normal heart sounds.  Exam reveals no gallop and no friction rub.   No murmur heard. Pulmonary/Chest: Effort normal and breath sounds normal. No respiratory distress. He has no wheezes. He has no rales. He exhibits no tenderness.    Abdominal: He exhibits no distension and no mass. There is no tenderness. There is no rebound and no guarding.  Musculoskeletal: He exhibits no edema and no tenderness.  Lymphadenopathy:    He has no cervical adenopathy.  Neurological: He is alert and oriented to person, place, and time. He has normal reflexes. He exhibits normal muscle tone. Coordination normal.  Skin: Skin is warm and dry. He is not diaphoretic. No erythema. No pallor.  Psychiatric: He has a normal mood and affect. His behavior is normal. Judgment and thought content normal.          Assessment & Plan:  HIV INFECTION Continue Reyataz Norvir Truvada. I proposed the idea of changing to Isentress and Truvada  HYPERLIPIDEMIA Check fasting lipid profile today. Again a regimen of Isentress and Truvada would be a  more "lipid friendly"  CAD To see Dr. Sharyn Lull in today. Again I  would think that she still needs to be on a statin Plavix and a beta blocker. We'll continue the Aldactone.   PERIPHERAL NEUROPATHY She claims  this is not well controlled with her current dose of Percocet so I'm increasing it to 4 tablets a day. 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

## 2012-07-09 NOTE — Assessment & Plan Note (Signed)
Continue Reyataz Norvir Truvada. I proposed the idea of changing to Isentress and Truvada

## 2012-07-22 ENCOUNTER — Telehealth: Payer: Self-pay

## 2012-07-22 ENCOUNTER — Other Ambulatory Visit: Payer: Self-pay

## 2012-07-22 DIAGNOSIS — I2510 Atherosclerotic heart disease of native coronary artery without angina pectoris: Secondary | ICD-10-CM

## 2012-07-22 MED ORDER — SPIRONOLACTONE 25 MG PO TABS: 25.0000 mg | ORAL_TABLET | Freq: Every day | ORAL | Status: DC

## 2012-07-22 NOTE — Telephone Encounter (Signed)
Pt calling for Percocet and spirolactone refill.   Records indicate he received a script from Dr Daiva Eves on 07-09-12, the date of his office visit #120. Pt was informed he can not get this medication refilled until 30 days from the date given.   I will send spirolactone to pharmacy.  Pt states he is in [place] with his Mother for now and wonders if we would be able to mail his Percocet script when due.  I have told him it is not our practice to send narcotic scripts via mail and I will check with his physician.    Laurell Josephs, RN

## 2012-08-07 ENCOUNTER — Other Ambulatory Visit: Payer: Self-pay | Admitting: *Deleted

## 2012-08-07 DIAGNOSIS — R52 Pain, unspecified: Secondary | ICD-10-CM

## 2012-08-07 MED ORDER — OXYCODONE-ACETAMINOPHEN 5-325 MG PO TABS: 1.0000 | ORAL_TABLET | Freq: Four times a day (QID) | ORAL | Status: DC | PRN

## 2012-08-25 ENCOUNTER — Other Ambulatory Visit: Payer: Self-pay | Admitting: *Deleted

## 2012-08-25 DIAGNOSIS — I2510 Atherosclerotic heart disease of native coronary artery without angina pectoris: Secondary | ICD-10-CM

## 2012-08-25 MED ORDER — SPIRONOLACTONE 25 MG PO TABS: 25.0000 mg | ORAL_TABLET | Freq: Every day | ORAL | Status: DC

## 2012-09-03 ENCOUNTER — Other Ambulatory Visit: Payer: Self-pay | Admitting: Licensed Clinical Social Worker

## 2012-09-03 DIAGNOSIS — R52 Pain, unspecified: Secondary | ICD-10-CM

## 2012-09-03 MED ORDER — OXYCODONE-ACETAMINOPHEN 5-325 MG PO TABS: 1.0000 | ORAL_TABLET | Freq: Four times a day (QID) | ORAL | Status: DC | PRN

## 2012-09-08 ENCOUNTER — Encounter: Payer: Self-pay | Admitting: Infectious Disease

## 2012-09-08 ENCOUNTER — Other Ambulatory Visit (HOSPITAL_COMMUNITY)
Admission: RE | Admit: 2012-09-08 | Discharge: 2012-09-08 | Disposition: A | Discharge status: Home / Self Care | Payer: Medicaid Other | Source: Ambulatory Visit | Attending: Infectious Disease | Admitting: Infectious Disease

## 2012-09-08 ENCOUNTER — Ambulatory Visit (INDEPENDENT_AMBULATORY_CARE_PROVIDER_SITE_OTHER): Payer: Self-pay | Admitting: Infectious Disease

## 2012-09-08 ENCOUNTER — Ambulatory Visit: Payer: Self-pay

## 2012-09-08 VITALS — BP 128/79 | HR 66 | Temp 98.0°F | Wt 223.0 lb

## 2012-09-08 DIAGNOSIS — Z113 Encounter for screening for infections with a predominantly sexual mode of transmission: Secondary | ICD-10-CM | POA: Insufficient documentation

## 2012-09-08 DIAGNOSIS — I2510 Atherosclerotic heart disease of native coronary artery without angina pectoris: Secondary | ICD-10-CM

## 2012-09-08 DIAGNOSIS — F640 Transsexualism: Secondary | ICD-10-CM

## 2012-09-08 DIAGNOSIS — G609 Hereditary and idiopathic neuropathy, unspecified: Secondary | ICD-10-CM

## 2012-09-08 DIAGNOSIS — R21 Rash and other nonspecific skin eruption: Secondary | ICD-10-CM | POA: Insufficient documentation

## 2012-09-08 DIAGNOSIS — Z0271 Encounter for disability determination: Secondary | ICD-10-CM | POA: Insufficient documentation

## 2012-09-08 DIAGNOSIS — E349 Endocrine disorder, unspecified: Secondary | ICD-10-CM

## 2012-09-08 DIAGNOSIS — B20 Human immunodeficiency virus [HIV] disease: Secondary | ICD-10-CM

## 2012-09-08 DIAGNOSIS — IMO0001 Reserved for inherently not codable concepts without codable children: Secondary | ICD-10-CM

## 2012-09-08 LAB — CBC WITH DIFFERENTIAL/PLATELET
Basophils Relative: 0 % (ref 0–1)
Eosinophils Absolute: 0 10*3/uL (ref 0.0–0.7)
Hemoglobin: 15 g/dL (ref 13.0–17.0)
MCH: 28.4 pg (ref 26.0–34.0)
MCHC: 33.9 g/dL (ref 30.0–36.0)
Monocytes Relative: 6 % (ref 3–12)
Neutrophils Relative %: 60 % (ref 43–77)
Platelets: 317 10*3/uL (ref 150–400)
RDW: 15.4 % (ref 11.5–15.5)

## 2012-09-08 LAB — LIPID PANEL
HDL: 35 mg/dL — ABNORMAL LOW (ref >39)
LDL Cholesterol: 123 mg/dL — ABNORMAL HIGH (ref 0–99)
Total CHOL/HDL Ratio: 5.4 Ratio
VLDL: 31 mg/dL (ref 0–40)

## 2012-09-08 MED ORDER — OXYCODONE-ACETAMINOPHEN 10-325 MG PO TABS: 1.0000 | ORAL_TABLET | Freq: Three times a day (TID) | ORAL | Status: DC | PRN

## 2012-09-08 MED ORDER — TRIAMCINOLONE ACETONIDE 0.5 % EX OINT: TOPICAL_OINTMENT | Freq: Two times a day (BID) | CUTANEOUS | Status: DC

## 2012-09-08 NOTE — Assessment & Plan Note (Signed)
I filled out his disability paperwork today. Note we do not have the equipment to do assessments on motor strength and extensive neurocognitive testing at some other facilities do have. Therefore much of my written assessment glide on his account to me of his abilities and limitations.

## 2012-09-08 NOTE — Assessment & Plan Note (Signed)
His a rash on his forearms as responded to clobetasol foam. All prescribe topical triamcinolone

## 2012-09-08 NOTE — Patient Instructions (Addendum)
I can up your oxycodone to 10mg  three times a day which you can fill on 09/22/12  In meantime you can take your current medicine two tablets three times a day  If we need further adjustments in this medicine I would want you referred to a pain clinic for management

## 2012-09-08 NOTE — Assessment & Plan Note (Signed)
He claims this is not ideally controlled by his gabapentin and current dose of narcotics. Oh increasing to 10 mg of oxycodone 3 times daily but will not go above that without assistance from a pain management clinic.

## 2012-09-08 NOTE — Assessment & Plan Note (Signed)
Perfect control 
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
## 2012-09-08 NOTE — Assessment & Plan Note (Signed)
Followed by Dr. Sharyn Lull. The BULK of his disabling symptoms such as dyspnea on exertion fatigue seem to be related to his coronary disease. He does have also severe neuropathy by his account that is also limiting his functioning.

## 2012-09-08 NOTE — Assessment & Plan Note (Signed)
Unwilling to prescribe him hormonal therapy if his cardiologist and primary care physician feels comfortable with this. If the patient remains off of tobacco if he understands accepts the risk for thromboembolic events including coronary disease from the hormone therapy

## 2012-09-09 LAB — COMPLETE METABOLIC PANEL WITH GFR
ALT: 16 U/L (ref 0–53)
AST: 18 U/L (ref 0–37)
Alkaline Phosphatase: 146 U/L — ABNORMAL HIGH (ref 39–117)
Potassium: 4.6 mEq/L (ref 3.5–5.3)
Sodium: 137 mEq/L (ref 135–145)
Total Bilirubin: 2.2 mg/dL — ABNORMAL HIGH (ref 0.3–1.2)
Total Protein: 7.8 g/dL (ref 6.0–8.3)

## 2012-09-09 LAB — HIV-1 RNA QUANT-NO REFLEX-BLD: HIV 1 RNA Quant: 90 copies/mL — ABNORMAL HIGH (ref <20)

## 2012-10-06 ENCOUNTER — Other Ambulatory Visit: Payer: Self-pay | Admitting: Licensed Clinical Social Worker

## 2012-10-06 DIAGNOSIS — Z0271 Encounter for disability determination: Secondary | ICD-10-CM

## 2012-10-06 DIAGNOSIS — G609 Hereditary and idiopathic neuropathy, unspecified: Secondary | ICD-10-CM

## 2012-10-06 DIAGNOSIS — B20 Human immunodeficiency virus [HIV] disease: Secondary | ICD-10-CM

## 2012-10-06 DIAGNOSIS — R21 Rash and other nonspecific skin eruption: Secondary | ICD-10-CM

## 2012-10-06 DIAGNOSIS — IMO0001 Reserved for inherently not codable concepts without codable children: Secondary | ICD-10-CM

## 2012-10-06 DIAGNOSIS — I2510 Atherosclerotic heart disease of native coronary artery without angina pectoris: Secondary | ICD-10-CM

## 2012-10-06 DIAGNOSIS — E349 Endocrine disorder, unspecified: Secondary | ICD-10-CM

## 2012-10-06 MED ORDER — OXYCODONE-ACETAMINOPHEN 10-325 MG PO TABS: 1.0000 | ORAL_TABLET | Freq: Three times a day (TID) | ORAL | Status: DC | PRN

## 2012-10-07 NOTE — Telephone Encounter (Signed)
tkk 

## 2012-10-27 ENCOUNTER — Other Ambulatory Visit: Payer: Self-pay

## 2012-11-10 ENCOUNTER — Ambulatory Visit: Payer: Self-pay | Admitting: Infectious Disease

## 2012-11-13 ENCOUNTER — Other Ambulatory Visit: Payer: Self-pay | Admitting: Licensed Clinical Social Worker

## 2012-11-13 DIAGNOSIS — G609 Hereditary and idiopathic neuropathy, unspecified: Secondary | ICD-10-CM

## 2012-11-13 MED ORDER — OXYCODONE-ACETAMINOPHEN 10-325 MG PO TABS: 1.0000 | ORAL_TABLET | Freq: Three times a day (TID) | ORAL | Status: DC | PRN

## 2012-11-14 ENCOUNTER — Other Ambulatory Visit: Payer: Self-pay

## 2012-11-21 ENCOUNTER — Other Ambulatory Visit: Payer: Self-pay

## 2012-11-21 DIAGNOSIS — Z0271 Encounter for disability determination: Secondary | ICD-10-CM

## 2012-11-21 DIAGNOSIS — IMO0001 Reserved for inherently not codable concepts without codable children: Secondary | ICD-10-CM

## 2012-11-21 DIAGNOSIS — I2510 Atherosclerotic heart disease of native coronary artery without angina pectoris: Secondary | ICD-10-CM

## 2012-11-21 DIAGNOSIS — G609 Hereditary and idiopathic neuropathy, unspecified: Secondary | ICD-10-CM

## 2012-11-21 DIAGNOSIS — E349 Endocrine disorder, unspecified: Secondary | ICD-10-CM

## 2012-11-21 DIAGNOSIS — B20 Human immunodeficiency virus [HIV] disease: Secondary | ICD-10-CM

## 2012-11-21 DIAGNOSIS — R21 Rash and other nonspecific skin eruption: Secondary | ICD-10-CM

## 2012-11-21 LAB — CBC WITH DIFFERENTIAL/PLATELET
Basophils Absolute: 0 10*3/uL (ref 0.0–0.1)
Eosinophils Relative: 0 % (ref 0–5)
Lymphocytes Relative: 37 % (ref 12–46)
Neutro Abs: 6 10*3/uL (ref 1.7–7.7)
Neutrophils Relative %: 56 % (ref 43–77)
Platelets: 323 10*3/uL (ref 150–400)
RBC: 5.04 MIL/uL (ref 4.22–5.81)
RDW: 14.2 % (ref 11.5–15.5)
WBC: 10.7 10*3/uL — ABNORMAL HIGH (ref 4.0–10.5)

## 2012-11-21 LAB — COMPLETE METABOLIC PANEL WITH GFR
ALT: 15 U/L (ref 0–53)
AST: 16 U/L (ref 0–37)
Calcium: 9.3 mg/dL (ref 8.4–10.5)
Chloride: 105 mEq/L (ref 96–112)
Creat: 0.89 mg/dL (ref 0.50–1.35)
Potassium: 4.2 mEq/L (ref 3.5–5.3)
Sodium: 135 mEq/L (ref 135–145)
Total Protein: 7.9 g/dL (ref 6.0–8.3)

## 2012-11-21 LAB — LIPID PANEL
LDL Cholesterol: 125 mg/dL — ABNORMAL HIGH (ref 0–99)
Total CHOL/HDL Ratio: 6 Ratio
VLDL: 35 mg/dL (ref 0–40)

## 2012-11-21 LAB — RPR

## 2012-11-24 LAB — HIV-1 RNA QUANT-NO REFLEX-BLD: HIV-1 RNA Quant, Log: 1.41 {Log} — ABNORMAL HIGH (ref <1.30)

## 2012-11-25 ENCOUNTER — Other Ambulatory Visit: Payer: Self-pay

## 2012-12-10 ENCOUNTER — Encounter: Payer: Self-pay | Admitting: Infectious Disease

## 2012-12-10 ENCOUNTER — Ambulatory Visit (INDEPENDENT_AMBULATORY_CARE_PROVIDER_SITE_OTHER): Payer: Self-pay | Admitting: Infectious Disease

## 2012-12-10 VITALS — BP 116/82 | HR 76 | Temp 97.9°F | Ht 72.0 in | Wt 215.0 lb

## 2012-12-10 DIAGNOSIS — I2510 Atherosclerotic heart disease of native coronary artery without angina pectoris: Secondary | ICD-10-CM

## 2012-12-10 DIAGNOSIS — G629 Polyneuropathy, unspecified: Secondary | ICD-10-CM

## 2012-12-10 DIAGNOSIS — E349 Endocrine disorder, unspecified: Secondary | ICD-10-CM

## 2012-12-10 DIAGNOSIS — G589 Mononeuropathy, unspecified: Secondary | ICD-10-CM

## 2012-12-10 DIAGNOSIS — Z23 Encounter for immunization: Secondary | ICD-10-CM

## 2012-12-10 DIAGNOSIS — IMO0001 Reserved for inherently not codable concepts without codable children: Secondary | ICD-10-CM

## 2012-12-10 DIAGNOSIS — F640 Transsexualism: Secondary | ICD-10-CM

## 2012-12-10 DIAGNOSIS — B20 Human immunodeficiency virus [HIV] disease: Secondary | ICD-10-CM

## 2012-12-10 MED ORDER — ESTRADIOL 2 MG PO TABS: 2.0000 mg | ORAL_TABLET | Freq: Every day | ORAL | Status: DC

## 2012-12-10 MED ORDER — ATORVASTATIN CALCIUM 20 MG PO TABS: 20.0000 mg | ORAL_TABLET | Freq: Every day | ORAL | Status: DC

## 2012-12-10 MED ORDER — OXYCODONE-ACETAMINOPHEN 10-650 MG PO TABS: 1.0000 | ORAL_TABLET | Freq: Four times a day (QID) | ORAL | Status: DC | PRN

## 2012-12-10 NOTE — Progress Notes (Signed)
Subjective:    Patient ID: Douglas Salazar, male    DOB: Oct 10, 1971, 41 y.o.   MRN: [id number]  HPI  41 year old transgenderer male with HIV who also has comorbid CAD who has remain perfectly virologically suppressed on Reyataz Norvir and Truvada.  She has NOT been able to procure letter from Dr. Sharyn Lull stating that he is comfortable with this patient starting on male hormones to treat his trans-gender identity. She was VERY passionate and determined to be placed on male hormone therapy and stated that she would sign a letter stating that SHE ACCEPTED the risks of such therapy and that she desired such therapy even at the risk of suffering MI, stroke of other thromboembolic complications.   She pointed out to me that she had stopped smoking--a requirement that I had made before consideration of such therapy.  After thorough consideration I was willing to consider prescribing hormonal therapy PROVIDED that Caiden accept the VERY HIGH risks of such therapy.   Certainly if my prime role as a physician IS ONLY to ensure that the patient achieve the longest life possible with the least amount of morbid suffering from preventable diseases as possible WITHOUT REGARD to the patients desired QUALITY of life within her rights in this current society then prescribing estrogen to Amiri would CLEARLY not be the RIGHT thing to do. However as her physician I have a duty to try as best as I am willing and able, to honor the wishes of this patient to  be happy and fulfilled as a transgender woman in the time that this patient has on earth then I feel that prescribing estrogen to this patient IF SHE IS WILLING TO accept the clear risks of thromboembolic disease, coronary disease is something that I am willing to do.   I do feel that Darrious is of sound mind and competent to make decisions for herself at this point in time. I have clearly outlined the risks for thromboembolic disease, further coronary events  all of which could be debilitating morbid or even fatal and the patient accepts these risks and wishes to proceed with estrogen therapy.   Dalten voiced her desire for such therapy and her understanding of stated risks to both myself as well as two of my nursing staff Starleen Arms and Marylen Ponto.   She also had stated that her percocet would soon be dc in its current form with 325 mg of tylenol and asked that this component of this controlled substance be changed to 650 mg   I spent greater than 42 minutes with the patient including greater than 50% of time in face to face counsel of the patient and in coordination of their care.    Review of Systems  Constitutional: Negative for fever, chills, diaphoresis, activity change, appetite change, fatigue and unexpected weight change.  HENT: Negative for congestion, sore throat, rhinorrhea, sneezing, trouble swallowing and sinus pressure.   Eyes: Negative for photophobia and visual disturbance.  Respiratory: Negative for cough, chest tightness, shortness of breath, wheezing and stridor.   Cardiovascular: Negative for chest pain, palpitations and leg swelling.  Gastrointestinal: Negative for nausea, vomiting, abdominal pain, diarrhea, constipation, blood in stool, abdominal distention and anal bleeding.  Genitourinary: Negative for dysuria, hematuria, flank pain and difficulty urinating.  Musculoskeletal: Negative for myalgias, back pain, joint swelling, arthralgias and gait problem.  Skin: Negative for color change, pallor, rash and wound.  Neurological: Negative for dizziness, tremors, weakness and light-headedness.  Hematological: Negative for adenopathy. Does  not bruise/bleed easily.  Psychiatric/Behavioral: Negative for behavioral problems, confusion, sleep disturbance, dysphoric mood, decreased concentration and agitation.       Objective:   Physical Exam  Constitutional: He is oriented to person, place, and time. He appears  well-developed and well-nourished. No distress.  HENT:  Head: Normocephalic and atraumatic.  Mouth/Throat: Oropharynx is clear and moist. No oropharyngeal exudate.  Eyes: Conjunctivae normal and EOM are normal. Pupils are equal, round, and reactive to light. No scleral icterus.  Neck: Normal range of motion. Neck supple. No JVD present.  Cardiovascular: Normal rate, regular rhythm and normal heart sounds.  Exam reveals no gallop and no friction rub.   No murmur heard. Pulmonary/Chest: Effort normal and breath sounds normal. No respiratory distress. He has no wheezes.  Abdominal: He exhibits no distension.  Musculoskeletal: He exhibits no edema and no tenderness.  Lymphadenopathy:    He has no cervical adenopathy.  Neurological: He is alert and oriented to person, place, and time. He has normal reflexes. Coordination normal.  Skin: Skin is warm and dry. He is not diaphoretic. No erythema. No pallor.  Psychiatric: He has a normal mood and affect. His behavior is normal. Judgment and thought content normal.          Assessment & Plan:   Transgender identity:  Abdalrahman DID sign paperwork stating that she understood all of the risks of estrogen and or progesterone therapy and wished to proceed with therapy. She stated that such risks were worth the changes she hoped to achieve with hormone therapy. I am willing with her acceptance of such risk to start estrogen therapy. I am however worried about the risks involved and my comfort with this course of action may very well change with time. I would also feel better with her seeing our counselor so that the issues of transgender identity would be more fully explored with a mental health professional who can better quantify and qualify the importance of gender identity to [place]. I will discuss further with Kamil in the coming days. I have written rx for estrogen but asked after further deliberation that she not yet begin the therapy until I have  thought this through further and disussed this with her. In the meantime she can continue her aldactone. Ultimately referral to a plastic surgeon might be where we need to go if we are going to help honor Jonathans wishes  HIV: very nicely controlled on reyataz, norvir and truvada  CAD see above re risks of estrogen therapy. I will add back a statin given she is NOT at goal with re to LDL

## 2012-12-12 ENCOUNTER — Telehealth: Payer: Self-pay | Admitting: Infectious Disease

## 2012-12-12 NOTE — Telephone Encounter (Signed)
Left message for Ramere asking him to hold off on starting estradiol until I can discuss this further with him

## 2012-12-15 ENCOUNTER — Telehealth: Payer: Self-pay | Admitting: Infectious Disease

## 2012-12-15 NOTE — Telephone Encounter (Signed)
Discussed estrogen therapy with Douglas Salazar and I explained my concern that she might not derive that great a benefit from this therapy and MIGHT hazard risk of MI or thromboembolic event.   I told her that I would feel better if she were to see a person more expert at transgender issues and that I would look into some of my contacts at [facility].   Perhaps Douglas Salazar here may know of some folks throughout the state who would be more expert than our group specifically with re to transgender?  I am dcing the estradiol for now and Douglas Salazar will nto start it.

## 2013-01-01 ENCOUNTER — Other Ambulatory Visit: Payer: Self-pay | Admitting: Licensed Clinical Social Worker

## 2013-01-01 ENCOUNTER — Telehealth: Payer: Self-pay | Admitting: Licensed Clinical Social Worker

## 2013-01-01 DIAGNOSIS — G629 Polyneuropathy, unspecified: Secondary | ICD-10-CM

## 2013-01-01 MED ORDER — OXYCODONE-ACETAMINOPHEN 10-650 MG PO TABS: 1.0000 | ORAL_TABLET | Freq: Four times a day (QID) | ORAL | Status: DC | PRN

## 2013-01-01 NOTE — Telephone Encounter (Signed)
Not going that high on percocet. We DO have some references for him from endocrinology to help with his desire for hormone therapy with complicating factor of his known CAD. I need to dig thru some emails about that first though

## 2013-01-01 NOTE — Telephone Encounter (Signed)
Patient would like an increase in his percocet currently he is getting 10-650 #90 and he would like to get 120 like he did when he was on 5/325. Please advise

## 2013-01-07 ENCOUNTER — Telehealth: Payer: Self-pay | Admitting: *Deleted

## 2013-01-07 NOTE — Telephone Encounter (Signed)
done

## 2013-01-07 NOTE — Telephone Encounter (Signed)
Pharmacy tech calling to verify percocet 10-650 rx was the only rx the patient needed filled at this time. Also notified that the pharmacy was out of 10-650 and would be substituting 5-325 strength with double the number of pills dispensed. Andree Coss, RN

## 2013-01-27 ENCOUNTER — Telehealth: Payer: Self-pay | Admitting: Licensed Clinical Social Worker

## 2013-01-27 NOTE — Telephone Encounter (Signed)
Patient called stating that the percocet he is currently on is being discontinued. He is currently on 10/650 he needs to be on the next one under this. He also wanted to know if he would be able to get more since the quantity is less?

## 2013-01-27 NOTE — Telephone Encounter (Signed)
I think it is 5/325

## 2013-01-27 NOTE — Telephone Encounter (Signed)
i dont know what the oxycodone dose is on the new percocet the aceiaminophen part is ofnegligle importanve

## 2013-01-28 ENCOUNTER — Other Ambulatory Visit: Payer: Self-pay | Admitting: Licensed Clinical Social Worker

## 2013-01-28 DIAGNOSIS — R52 Pain, unspecified: Secondary | ICD-10-CM

## 2013-01-28 MED ORDER — OXYCODONE-ACETAMINOPHEN 10-325 MG PO TABS: 1.0000 | ORAL_TABLET | Freq: Four times a day (QID) | ORAL | Status: DC | PRN

## 2013-01-29 NOTE — Telephone Encounter (Signed)
Douglas Salazar is there not still available 10mg  of oxycodone by itself or with a lower dose of acetaminophen compounded with it? I would prefer to give him straight oxycodone at 10mg  or oxycodone at the 10mg  with whatever tylenol is available coformulated than give him 2 x number of percocet 10s he has been getting

## 2013-03-02 ENCOUNTER — Other Ambulatory Visit: Payer: Self-pay | Admitting: Licensed Clinical Social Worker

## 2013-03-02 DIAGNOSIS — R52 Pain, unspecified: Secondary | ICD-10-CM

## 2013-03-02 MED ORDER — OXYCODONE-ACETAMINOPHEN 10-325 MG PO TABS: 1.0000 | ORAL_TABLET | Freq: Four times a day (QID) | ORAL | Status: DC | PRN

## 2013-03-10 IMAGING — CR DG CHEST 2V
2 series · 2 of 2 positions shown · non-contrast
Comparison: 08/23/2009

CLINICAL DATA: Chest pain, shortness of breath

CHEST - 2 VIEW

[w chest pa]
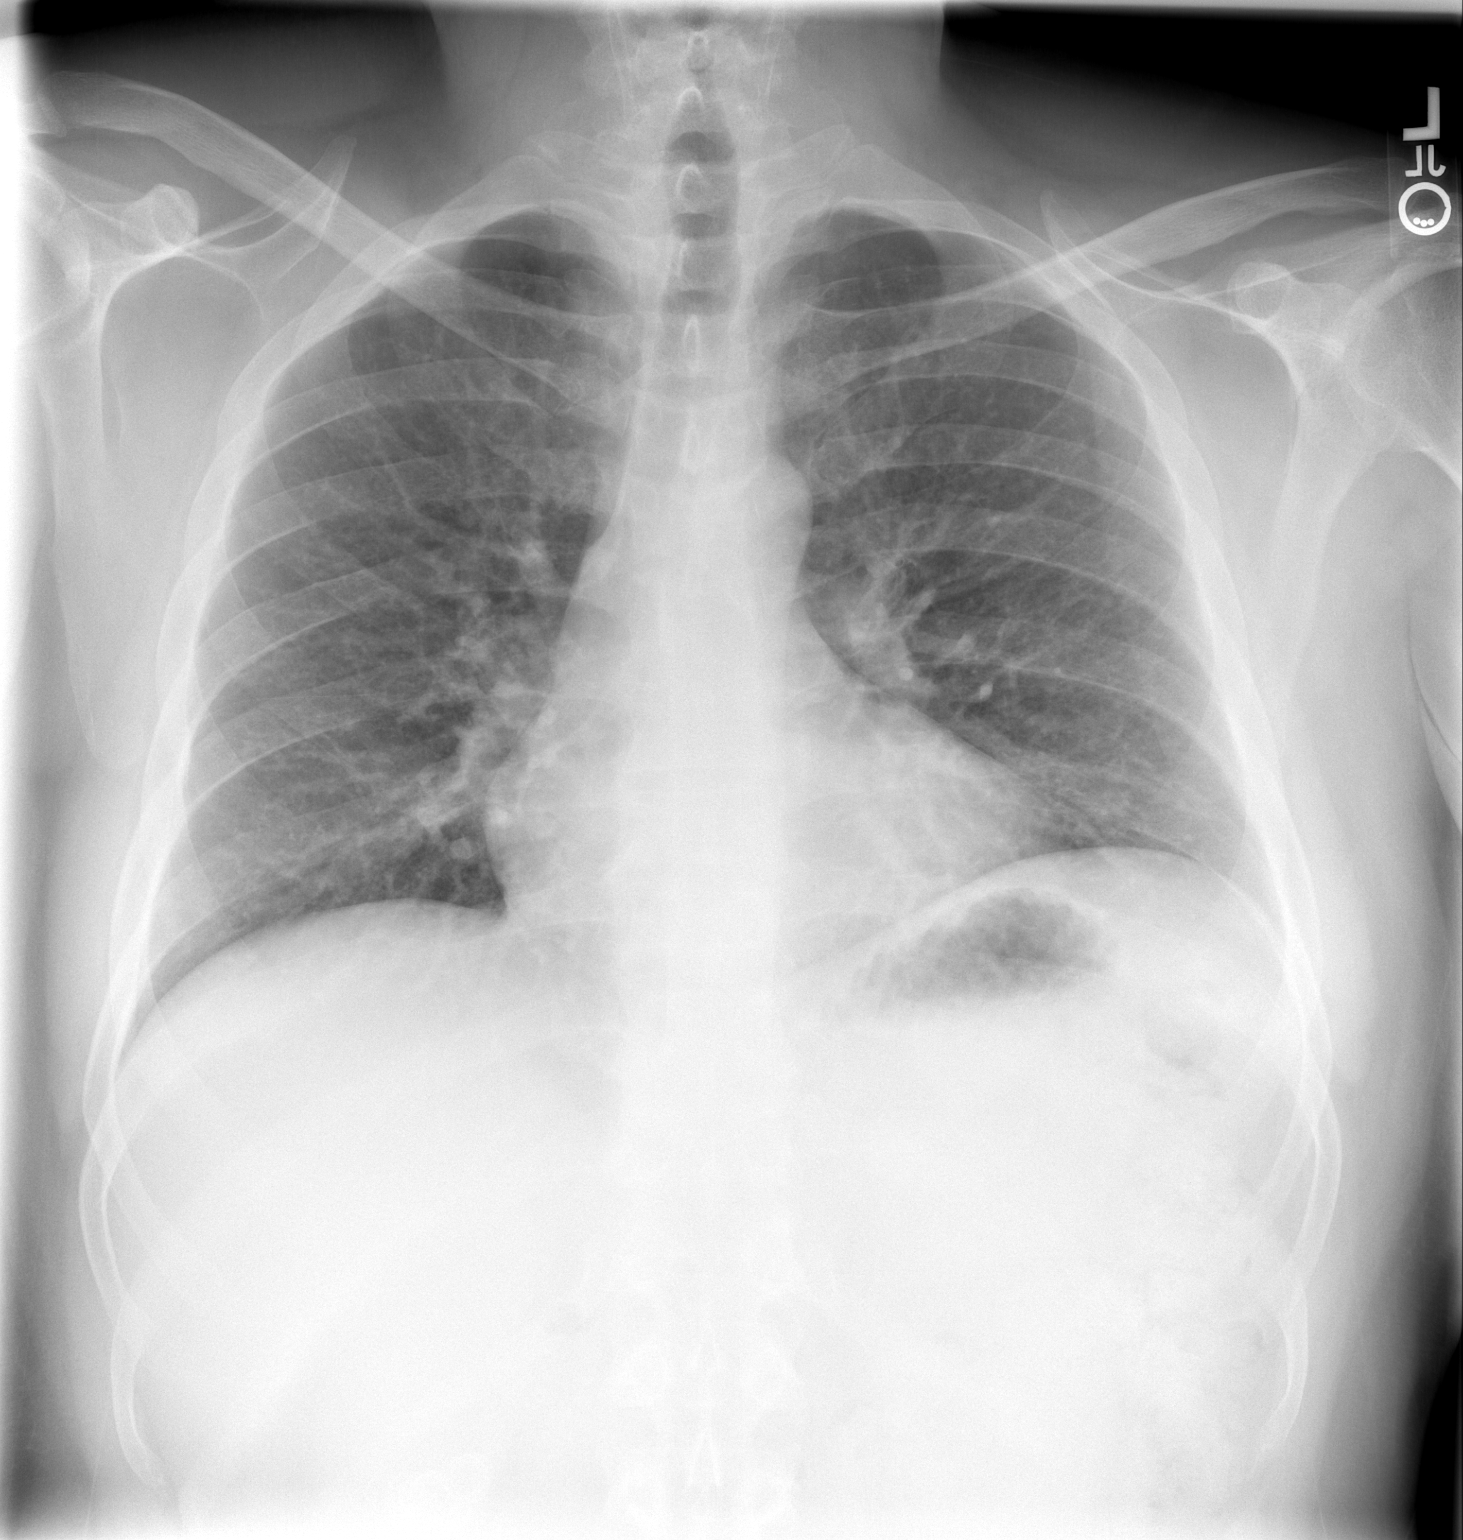

[w chest lat]
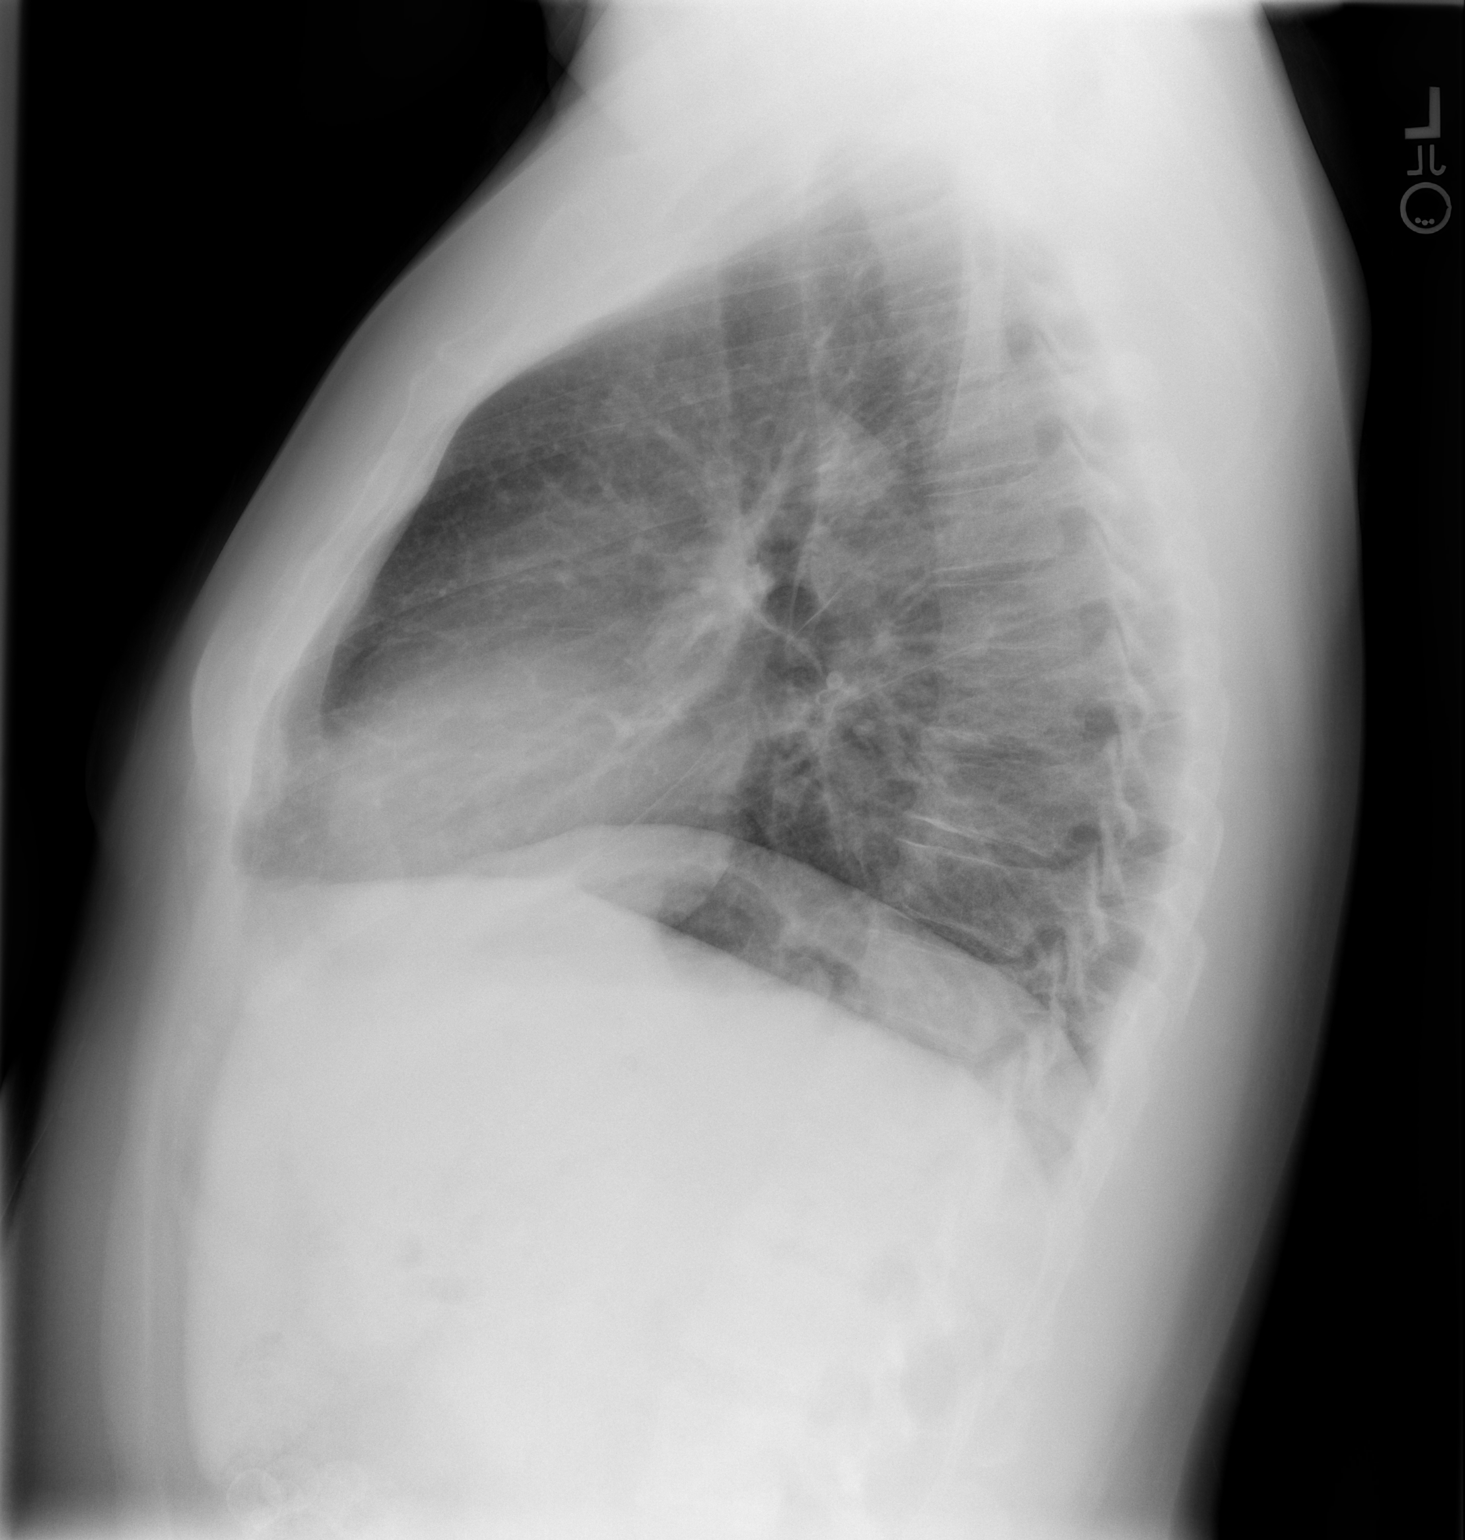

[2 of 2 positions shown; findings below may reference images not displayed]

FINDINGS: Persistent mild elevation of the left diaphragmatic
leaflet. Lungs clear.  Heart size and pulmonary vascularity normal.
No effusion.  Visualized bones unremarkable.
IMPRESSION: No acute disease

## 2013-03-26 ENCOUNTER — Other Ambulatory Visit: Payer: Self-pay | Admitting: Licensed Clinical Social Worker

## 2013-03-26 ENCOUNTER — Telehealth: Payer: Self-pay | Admitting: Licensed Clinical Social Worker

## 2013-03-26 DIAGNOSIS — F649 Gender identity disorder, unspecified: Secondary | ICD-10-CM

## 2013-03-26 NOTE — Telephone Encounter (Signed)
Then he can have 100

## 2013-03-26 NOTE — Telephone Encounter (Signed)
Patient called stating that he wanted to know if Dr. Daiva Eves will reconsider his hormone therapy and also increase his narcotics from 90 to 120 because he is running out during the last week before refills.

## 2013-03-26 NOTE — Telephone Encounter (Signed)
So he is taking his percocet MORE than three times a day? This is a very large dose of narcotics to go to 120.  I am not esp comfortable making that change at this point absent a visit. We may also need to consider ms contin and MSIR switch because this is pushing the dose to places where we would need long acting narcotics

## 2013-03-26 NOTE — Telephone Encounter (Signed)
Did we ever send Douglas Salazar to an endocrine MD at [facility]. I would prefer for them to handle this. I am not comfortable with his h x of known CAD and his cardiologist not being comfortable either

## 2013-03-26 NOTE — Telephone Encounter (Signed)
Patient wants to stay on Percocet but he would like just a" few more to get him through the whole month" per patient he doesn't want anything stronger. He also would like that referral to Endocrinology.

## 2013-03-26 NOTE — Telephone Encounter (Signed)
No but I can certainly make the referral. What about his narcotic prescription?

## 2013-03-27 ENCOUNTER — Other Ambulatory Visit: Payer: Self-pay | Admitting: Licensed Clinical Social Worker

## 2013-03-27 DIAGNOSIS — R52 Pain, unspecified: Secondary | ICD-10-CM

## 2013-03-27 NOTE — Telephone Encounter (Signed)
That is fine thanks Tamika! 

## 2013-03-27 NOTE — Telephone Encounter (Signed)
Patient is coming in on Monday to discuss his increased pain, and to do labs.

## 2013-03-30 ENCOUNTER — Other Ambulatory Visit: Payer: Self-pay

## 2013-03-30 ENCOUNTER — Encounter: Payer: Self-pay | Admitting: Infectious Disease

## 2013-03-30 ENCOUNTER — Ambulatory Visit (INDEPENDENT_AMBULATORY_CARE_PROVIDER_SITE_OTHER): Payer: Self-pay | Admitting: Infectious Disease

## 2013-03-30 ENCOUNTER — Other Ambulatory Visit: Payer: Self-pay | Admitting: Infectious Disease

## 2013-03-30 VITALS — BP 128/86 | HR 60 | Temp 98.2°F | Ht 72.0 in | Wt 214.0 lb

## 2013-03-30 DIAGNOSIS — M779 Enthesopathy, unspecified: Secondary | ICD-10-CM

## 2013-03-30 DIAGNOSIS — R52 Pain, unspecified: Secondary | ICD-10-CM

## 2013-03-30 DIAGNOSIS — E349 Endocrine disorder, unspecified: Secondary | ICD-10-CM

## 2013-03-30 DIAGNOSIS — F1120 Opioid dependence, uncomplicated: Secondary | ICD-10-CM

## 2013-03-30 DIAGNOSIS — G8929 Other chronic pain: Secondary | ICD-10-CM | POA: Insufficient documentation

## 2013-03-30 DIAGNOSIS — F640 Transsexualism: Secondary | ICD-10-CM

## 2013-03-30 DIAGNOSIS — I2510 Atherosclerotic heart disease of native coronary artery without angina pectoris: Secondary | ICD-10-CM

## 2013-03-30 DIAGNOSIS — M898X9 Other specified disorders of bone, unspecified site: Secondary | ICD-10-CM

## 2013-03-30 DIAGNOSIS — E785 Hyperlipidemia, unspecified: Secondary | ICD-10-CM

## 2013-03-30 DIAGNOSIS — I25810 Atherosclerosis of coronary artery bypass graft(s) without angina pectoris: Secondary | ICD-10-CM

## 2013-03-30 DIAGNOSIS — M7580 Other shoulder lesions, unspecified shoulder: Secondary | ICD-10-CM

## 2013-03-30 DIAGNOSIS — IMO0002 Reserved for concepts with insufficient information to code with codable children: Secondary | ICD-10-CM

## 2013-03-30 DIAGNOSIS — M792 Neuralgia and neuritis, unspecified: Secondary | ICD-10-CM

## 2013-03-30 DIAGNOSIS — G894 Chronic pain syndrome: Secondary | ICD-10-CM

## 2013-03-30 DIAGNOSIS — IMO0001 Reserved for inherently not codable concepts without codable children: Secondary | ICD-10-CM

## 2013-03-30 DIAGNOSIS — Z87891 Personal history of nicotine dependence: Secondary | ICD-10-CM

## 2013-03-30 LAB — COMPLETE METABOLIC PANEL WITH GFR
ALT: 15 U/L (ref 0–53)
AST: 16 U/L (ref 0–37)
CO2: 25 mEq/L (ref 19–32)
Calcium: 9.2 mg/dL (ref 8.4–10.5)
Chloride: 102 mEq/L (ref 96–112)
Creat: 0.79 mg/dL (ref 0.50–1.35)
GFR, Est African American: >89 mL/min
Potassium: 4.2 mEq/L (ref 3.5–5.3)
Sodium: 136 mEq/L (ref 135–145)
Total Protein: 7.7 g/dL (ref 6.0–8.3)

## 2013-03-30 LAB — CBC WITH DIFFERENTIAL/PLATELET
Basophils Relative: 0 % (ref 0–1)
Eosinophils Absolute: 0.1 10*3/uL (ref 0.0–0.7)
Eosinophils Relative: 1 % (ref 0–5)
Lymphs Abs: 5 10*3/uL — ABNORMAL HIGH (ref 0.7–4.0)
MCH: 29.1 pg (ref 26.0–34.0)
MCHC: 34.2 g/dL (ref 30.0–36.0)
MCV: 85.2 fL (ref 78.0–100.0)
Monocytes Relative: 6 % (ref 3–12)
Neutrophils Relative %: 58 % (ref 43–77)
Platelets: 270 10*3/uL (ref 150–400)
RBC: 4.67 MIL/uL (ref 4.22–5.81)

## 2013-03-30 MED ORDER — OXYCODONE-ACETAMINOPHEN 10-325 MG PO TABS: 1.0000 | ORAL_TABLET | Freq: Three times a day (TID) | ORAL | Status: DC | PRN

## 2013-03-30 MED ORDER — ATORVASTATIN CALCIUM 40 MG PO TABS: 40.0000 mg | ORAL_TABLET | Freq: Every day | ORAL | Status: DC

## 2013-03-30 MED ORDER — GABAPENTIN 800 MG PO TABS: 800.0000 mg | ORAL_TABLET | Freq: Three times a day (TID) | ORAL | Status: DC

## 2013-03-30 NOTE — Progress Notes (Signed)
Subjective:    Patient ID: Douglas Salazar, male    DOB: 18-Apr-1971, 42 y.o.   MRN: [id number]  HPI   42 year old transgender male with HIV who also has comorbid CAD who has remain perfectly virologically suppressed on Reyataz Norvir and Truvada.  Please see my last clinic note and phone note in which I wrestled with dilemna of whether or not to treat patients transgender identity disorder with hormonal therapy. I had Douglas Salazar sign a note saying that she would accept the risks (including another MI in pt with known CAD) along with potential benefits to Salazar emotional well being of hormone therpay to help with gender re-assignment hormonal therapy. However I ultimately decided that I was not comfortable with decision to be the provider rx hormonal therapy to the pt and we are in process of referring to [facility] Endocr[facility]ology.  Today she had two main isssues #1 was reconsideration of my decision re hormonal therapy  #2 was Salazar pain. She states that she ran out of percocet #90 10 mg strenght that I had been rx for several months last Tuesday. She claims that she requires 10mg  four to five times daily to control Salazar pain. She says this to me in a calm voice with legs crossed and in no clear distress and no evidence grossly of opiate withdrawal  She claimed that she had significant pain on bilateral heels from bone spurs as well. Salazar main pain is from neuropathic pain and hands hands worse than feet. I have asked that she ultimately get re-established with Neurology which she reluctantly says she will do though she currently does not have a payor soruce without insurance at this point.  I spent greater than 45 minutes with the patient including greater than 50% of time in face to face counsel of the patient and in coordination of their care.    Review of Systems  Constitutional: Negative for fever, chills, diaphoresis, activity change, appetite change, fatigue and unexpected weight change.  HENT: Negative  for congestion, sore throat, rhinorrhea, sneezing, trouble swallowing and sinus pressure.   Eyes: Negative for photophobia and visual disturbance.  Respiratory: Negative for cough, chest tightness, shortness of breath, wheezing and stridor.   Cardiovascular: Negative for chest pain, palpitations and leg swelling.  Gastrointestinal: Negative for nausea, vomiting, abdominal pain, diarrhea, constipation, blood in stool, abdominal distention and anal bleeding.  Genitourinary: Negative for dysuria, hematuria, flank pain and difficulty urinating.  Musculoskeletal: Positive for myalgias. Negative for back pain, joint swelling, arthralgias and gait problem.  Skin: Negative for color change, pallor, rash and wound.  Neurological: Positive for numbness. Negative for dizziness, tremors, weakness and light-headedness.  Hematological: Negative for adenopathy. Does not bruise/bleed easily.  Psychiatric/Behavioral: Negative for behavioral problems, confusion, sleep disturbance, dysphoric mood, decreased concentration and agitation.       Objective:   Physical Exam  Constitutional: He is oriented to person, place, and time. He appears well-developed and well-nourished. No distress.  HENT:  Head: Normocephalic and atraumatic.  Mouth/Throat: Oropharynx is clear and moist. No oropharyngeal exudate.  Eyes: Conjunctivae and EOM are normal. Pupils are equal, round, and reactive to light.  Neck: Normal range of motion. Neck supple.  Cardiovascular: Normal rate and regular rhythm.   Pulmonary/Chest: Effort normal. No respiratory distress.  Abdominal: He exhibits no distension.  Musculoskeletal: He exhibits no edema and no tenderness.  Neurological: He is alert and oriented to person, place, and time. He has normal reflexes. He displays normal reflexes. Coordination normal.  Skin: Skin is warm and dry. He is not diaphoretic. No erythema. No pallor.  Psychiatric: He has a normal mood and affect. His behavior is  normal. Judgment and thought content normal.          Assessment & Plan:   Transgender identity:  Douglas Salazar DID sign paperwork stating that she understood all of the risks of estrogen and or progesterone therapy and wished to proceed with therapy and I have documented my own opinions for and against such therapy. I remain at this point NOT willing to rx hormonal rx for Douglas Salazar on to [facility] Endocr[facility]ology.  HIV: very nicely controlled on reyataz, norvir and truvada. Might benefit from a more lipid friendly regimen but she was not esp interested in this and we do lack records from Salazar time in [place]  CAD see above re risks of estrogen therapy. I have written for higher dose of lipitor though I am skeptical she is taking this med as it is prescribed (she claimed she was to my pharmacist), statin increased  Obesity: tremendous job losing weight  Smoking: she quit smoking and that is a highly commendable thing that has acted to reduce risk here  Neuropathic pain: I have written rx for higher dose of gabapentin. I will also give Salazar enough percocet to get thru next 5 days plus typical #90 of 10mg  percocets. I am surprised that if she truly had run out of narcotics taht she was actually taking and that she is in such severe pain by she has no obvious exam findings of distress or opiate withdrawal. I have ordered oxycodone level in blood. If this is negative will need to look into how long it would remain in blood (she claimed to not have urine for urine test). I DO NOT WANT TO GO up on dose and I have a suspicion of diversion here. I have also asked Salazar to be seen by our addictions counselor as I think that there could be a component of abuse/dependence. The pt has doulbed his narcotic dose in less than  Year  Bone spurs: underwhelming on exam. Can consider plain films.

## 2013-04-01 LAB — HIV-1 RNA QUANT-NO REFLEX-BLD
HIV 1 RNA Quant: <20 {copies}/mL (ref <20)
HIV-1 RNA Quant, Log: <1.3 {Log} (ref <1.30)

## 2013-04-01 LAB — T-HELPER CELL (CD4) - (RCID CLINIC ONLY): CD4 % Helper T Cell: 31 % — ABNORMAL LOW (ref 33–55)

## 2013-04-02 ENCOUNTER — Other Ambulatory Visit: Payer: Self-pay | Admitting: Licensed Clinical Social Worker

## 2013-04-02 DIAGNOSIS — E785 Hyperlipidemia, unspecified: Secondary | ICD-10-CM

## 2013-04-02 DIAGNOSIS — E349 Endocrine disorder, unspecified: Secondary | ICD-10-CM

## 2013-04-02 DIAGNOSIS — I2510 Atherosclerotic heart disease of native coronary artery without angina pectoris: Secondary | ICD-10-CM

## 2013-04-02 DIAGNOSIS — IMO0001 Reserved for inherently not codable concepts without codable children: Secondary | ICD-10-CM

## 2013-04-02 DIAGNOSIS — G609 Hereditary and idiopathic neuropathy, unspecified: Secondary | ICD-10-CM

## 2013-04-02 DIAGNOSIS — B20 Human immunodeficiency virus [HIV] disease: Secondary | ICD-10-CM

## 2013-04-02 DIAGNOSIS — Z0271 Encounter for disability determination: Secondary | ICD-10-CM

## 2013-04-02 DIAGNOSIS — R21 Rash and other nonspecific skin eruption: Secondary | ICD-10-CM

## 2013-04-02 MED ORDER — ATORVASTATIN CALCIUM 40 MG PO TABS: 40.0000 mg | ORAL_TABLET | Freq: Every day | ORAL | Status: DC

## 2013-04-02 MED ORDER — TRIAMCINOLONE ACETONIDE 0.5 % EX OINT: TOPICAL_OINTMENT | Freq: Two times a day (BID) | CUTANEOUS | Status: DC

## 2013-04-02 MED ORDER — GABAPENTIN 800 MG PO TABS: 800.0000 mg | ORAL_TABLET | Freq: Three times a day (TID) | ORAL | Status: DC

## 2013-04-02 NOTE — Telephone Encounter (Signed)
Spoke with [facility] and found out that Dr. Christophe Louis use to see patients with gender identity disorder but stopped 1 year ago.  Dr. Julien Girt, clinical director and trans-gender specialist, at [facility] [phone]) does however see pts for this. I am faxing over patients records (fax [phone]) for review and patient will also need a psychological evaluation.  If patient is cleared then Dr. Julien Girt will see her for hormone therapy. I have called patient and dicussed the above. She verbalized understanding and greatly appreciated it. Tacey Heap RN

## 2013-04-08 ENCOUNTER — Other Ambulatory Visit: Payer: Self-pay

## 2013-04-09 LAB — OTHER SOLSTAS TEST

## 2013-04-22 ENCOUNTER — Ambulatory Visit: Payer: Self-pay | Admitting: Infectious Disease

## 2013-04-28 ENCOUNTER — Other Ambulatory Visit: Payer: Self-pay | Admitting: Licensed Clinical Social Worker

## 2013-04-28 DIAGNOSIS — R52 Pain, unspecified: Secondary | ICD-10-CM

## 2013-04-28 MED ORDER — OXYCODONE-ACETAMINOPHEN 10-325 MG PO TABS: 1.0000 | ORAL_TABLET | Freq: Three times a day (TID) | ORAL | Status: DC | PRN

## 2013-05-11 ENCOUNTER — Encounter: Payer: Self-pay | Admitting: *Deleted

## 2013-05-19 ENCOUNTER — Other Ambulatory Visit: Payer: Self-pay | Admitting: *Deleted

## 2013-05-19 ENCOUNTER — Telehealth: Payer: Self-pay | Admitting: *Deleted

## 2013-05-19 DIAGNOSIS — B20 Human immunodeficiency virus [HIV] disease: Secondary | ICD-10-CM

## 2013-05-19 MED ORDER — EMTRICITABINE-TENOFOVIR DF 200-300 MG PO TABS: 1.0000 | ORAL_TABLET | Freq: Every day | ORAL | Status: DC

## 2013-05-19 MED ORDER — ATAZANAVIR SULFATE 300 MG PO CAPS: 300.0000 mg | ORAL_CAPSULE | Freq: Every day | ORAL | Status: DC

## 2013-05-19 MED ORDER — RITONAVIR 100 MG PO TABS: 100.0000 mg | ORAL_TABLET | Freq: Every day | ORAL | Status: DC

## 2013-05-19 NOTE — Telephone Encounter (Signed)
error 

## 2013-06-01 ENCOUNTER — Other Ambulatory Visit: Payer: Self-pay | Admitting: Licensed Clinical Social Worker

## 2013-06-01 DIAGNOSIS — R52 Pain, unspecified: Secondary | ICD-10-CM

## 2013-06-01 MED ORDER — OXYCODONE-ACETAMINOPHEN 10-325 MG PO TABS: 1.0000 | ORAL_TABLET | Freq: Three times a day (TID) | ORAL | Status: DC | PRN

## 2013-06-04 ENCOUNTER — Telehealth: Payer: Self-pay | Admitting: Licensed Clinical Social Worker

## 2013-06-04 NOTE — Telephone Encounter (Signed)
Patient called wanting Dr. Daiva Eves to change the prescription for percocet from every 8 hours to every 6 hours so he wont have to wait every 39 days to get it filled.

## 2013-06-05 ENCOUNTER — Other Ambulatory Visit: Payer: Self-pay | Admitting: Licensed Clinical Social Worker

## 2013-06-05 DIAGNOSIS — I2510 Atherosclerotic heart disease of native coronary artery without angina pectoris: Secondary | ICD-10-CM

## 2013-06-05 MED ORDER — SPIRONOLACTONE 25 MG PO TABS: 25.0000 mg | ORAL_TABLET | Freq: Every day | ORAL | Status: DC

## 2013-06-05 NOTE — Telephone Encounter (Signed)
If he is really taking every eight hours then script should be for #90 then

## 2013-06-05 NOTE — Telephone Encounter (Signed)
How exactly is Douglas Salazar taking his percocet, is he sometimes taking it every 8 hours and at others taking every 6 hours. If so I am ok changing to every 6-8 hours for pain but I am NOT increasing the AMOUNT of pills

## 2013-06-05 NOTE — Telephone Encounter (Signed)
He is taking it every 8 hours from my understanding, but he wants to be able to get the medication every 30 days, and the way it is written per the pharmacy he can only get every 39 days.

## 2013-06-05 NOTE — Telephone Encounter (Signed)
Thanks Tamika. 

## 2013-06-05 NOTE — Telephone Encounter (Signed)
Ok I will let the patient know 

## 2013-06-30 ENCOUNTER — Other Ambulatory Visit: Payer: Self-pay | Admitting: Licensed Clinical Social Worker

## 2013-06-30 ENCOUNTER — Telehealth: Payer: Self-pay | Admitting: Licensed Clinical Social Worker

## 2013-06-30 DIAGNOSIS — R52 Pain, unspecified: Secondary | ICD-10-CM

## 2013-06-30 MED ORDER — OXYCODONE-ACETAMINOPHEN 10-325 MG PO TABS: 1.0000 | ORAL_TABLET | Freq: Four times a day (QID) | ORAL | Status: DC | PRN

## 2013-06-30 MED ORDER — OXYCODONE-ACETAMINOPHEN 10-325 MG PO TABS: 1.0000 | ORAL_TABLET | Freq: Three times a day (TID) | ORAL | Status: DC | PRN

## 2013-06-30 NOTE — Telephone Encounter (Signed)
Patient called stating that his pharmacy recommended the prescription to be written for every 6 to 8 hours if he wants to pick his prescription up every 30 days on the 1st of the month. I called the pharmacy and spoke with Apolinar Junes and he stated that the patient would have to wait 8 more days with the way it is written now. Per Dr. Daiva Eves he agreed to every 6 to 8 hours. Prescription was changed and signed by Dr. Luciana Axe. Also changed on the contract, patient will need UDS with next pick up. Note is posted on contract.

## 2013-07-15 ENCOUNTER — Ambulatory Visit (INDEPENDENT_AMBULATORY_CARE_PROVIDER_SITE_OTHER): Payer: Self-pay | Admitting: Infectious Disease

## 2013-07-15 ENCOUNTER — Encounter: Payer: Self-pay | Admitting: Infectious Disease

## 2013-07-15 VITALS — BP 111/74 | HR 55 | Temp 97.9°F | Ht 72.0 in | Wt 201.0 lb

## 2013-07-15 DIAGNOSIS — E785 Hyperlipidemia, unspecified: Secondary | ICD-10-CM

## 2013-07-15 DIAGNOSIS — I2510 Atherosclerotic heart disease of native coronary artery without angina pectoris: Secondary | ICD-10-CM

## 2013-07-15 DIAGNOSIS — B20 Human immunodeficiency virus [HIV] disease: Secondary | ICD-10-CM

## 2013-07-15 DIAGNOSIS — M779 Enthesopathy, unspecified: Secondary | ICD-10-CM

## 2013-07-15 DIAGNOSIS — Z113 Encounter for screening for infections with a predominantly sexual mode of transmission: Secondary | ICD-10-CM

## 2013-07-15 DIAGNOSIS — I25810 Atherosclerosis of coronary artery bypass graft(s) without angina pectoris: Secondary | ICD-10-CM

## 2013-07-15 DIAGNOSIS — M898X9 Other specified disorders of bone, unspecified site: Secondary | ICD-10-CM

## 2013-07-15 DIAGNOSIS — R21 Rash and other nonspecific skin eruption: Secondary | ICD-10-CM | POA: Insufficient documentation

## 2013-07-15 LAB — CBC WITH DIFFERENTIAL/PLATELET
Basophils Relative: 0 % (ref 0–1)
Eosinophils Absolute: 0.1 10*3/uL (ref 0.0–0.7)
Eosinophils Relative: 1 % (ref 0–5)
HCT: 43 % (ref 39.0–52.0)
Lymphs Abs: 4.3 10*3/uL — ABNORMAL HIGH (ref 0.7–4.0)
Monocytes Absolute: 0.6 10*3/uL (ref 0.1–1.0)
Monocytes Relative: 6 % (ref 3–12)
Platelets: 284 10*3/uL (ref 150–400)
RBC: 4.86 MIL/uL (ref 4.22–5.81)
RDW: 14.6 % (ref 11.5–15.5)
WBC: 10.8 10*3/uL — ABNORMAL HIGH (ref 4.0–10.5)

## 2013-07-15 MED ORDER — TRIAMCINOLONE ACETONIDE 0.5 % EX OINT: TOPICAL_OINTMENT | Freq: Two times a day (BID) | CUTANEOUS | Status: DC

## 2013-07-15 NOTE — Progress Notes (Signed)
  Subjective:    Patient ID: Harinder Romas, male    DOB: 22-Jul-1971, 42 y.o.   MRN: [id number]  HPI   42 year old transgender male with HIV who also has comorbid CAD who has remain perfectly virologically suppressed on Reyataz Norvir and Truvada.  She had one main issue today which was rash on dorsum on her arms which is intensely pruritic and which she scratches. I had rx her triamcinolone but she does not appear to have taken it, will send in rx once again.  Secondly she is complaining of ankle, foot pain and pain in elbows that is chronic in nature.    Review of Systems  Constitutional: Negative for fever, chills, diaphoresis, activity change, appetite change, fatigue and unexpected weight change.  HENT: Negative for congestion, sore throat, rhinorrhea, sneezing, trouble swallowing and sinus pressure.   Eyes: Negative for photophobia and visual disturbance.  Respiratory: Negative for cough, chest tightness, shortness of breath, wheezing and stridor.   Cardiovascular: Negative for chest pain, palpitations and leg swelling.  Gastrointestinal: Negative for nausea, vomiting, abdominal pain, diarrhea, constipation, blood in stool, abdominal distention and anal bleeding.  Genitourinary: Negative for dysuria, hematuria, flank pain and difficulty urinating.  Musculoskeletal: Positive for myalgias. Negative for back pain, joint swelling, arthralgias and gait problem.  Skin: Positive for rash. Negative for color change, pallor and wound.  Neurological: Positive for numbness. Negative for dizziness, tremors, weakness and light-headedness.  Hematological: Negative for adenopathy. Does not bruise/bleed easily.  Psychiatric/Behavioral: Negative for behavioral problems, confusion, sleep disturbance, dysphoric mood, decreased concentration and agitation.       Objective:   Physical Exam  Constitutional: He is oriented to person, place, and time. He appears well-developed and well-nourished. No  distress.  HENT:  Head: Normocephalic and atraumatic.  Mouth/Throat: Oropharynx is clear and moist. No oropharyngeal exudate.  Eyes: Conjunctivae and EOM are normal. Pupils are equal, round, and reactive to light.  Neck: Normal range of motion. Neck supple.  Cardiovascular: Normal rate and regular rhythm.   Pulmonary/Chest: Effort normal. No respiratory distress.  Abdominal: He exhibits no distension.  Musculoskeletal: He exhibits no edema and no tenderness.  Neurological: He is alert and oriented to person, place, and time. He has normal reflexes. Coordination normal.  Skin: Skin is warm and dry. He is not diaphoretic. No erythema. No pallor.  Psychiatric: He has a normal mood and affect. His behavior is normal. Judgment and thought content normal.          Assessment & Plan:   HIV: very nicely controlled on reyataz, norvir and truvada. Might benefit from a more lipid friendly regimen but she was not esp interested in this   CAD see above re risks of estrogen therapy. I have written for higher dose of lipitor and she does claim to be taking this. Will check LDL today  Obesity: tremendous job losing weight and continues to do so  Smoking: she quit smoking and that is a highly commendable thing that has acted to reduce risk here  Neuropathic pain: I have written rx for higher dose of gabapentin.    Bone spurs: underwhelming on exam.

## 2013-07-16 LAB — COMPLETE METABOLIC PANEL WITH GFR
Albumin: 4.4 g/dL (ref 3.5–5.2)
Alkaline Phosphatase: 135 U/L — ABNORMAL HIGH (ref 39–117)
BUN: 12 mg/dL (ref 6–23)
Creat: 0.91 mg/dL (ref 0.50–1.35)
GFR, Est Non African American: >89 mL/min
Glucose, Bld: 106 mg/dL — ABNORMAL HIGH (ref 70–99)
Potassium: 4.6 mEq/L (ref 3.5–5.3)

## 2013-07-21 LAB — VITAMIN D PNL(25-HYDRXY+1,25-DIHY)-BLD
Vit D, 25-Hydroxy: 47 ng/mL (ref 30–89)
Vitamin D 1, 25 (OH)2 Total: 47 pg/mL (ref 18–72)
Vitamin D2 1, 25 (OH)2: <8 pg/mL
Vitamin D3 1, 25 (OH)2: 47 pg/mL

## 2013-07-21 LAB — HLA B*5701: HLA-B*5701: NEGATIVE

## 2013-07-28 ENCOUNTER — Other Ambulatory Visit: Payer: Self-pay | Admitting: Licensed Clinical Social Worker

## 2013-07-28 ENCOUNTER — Telehealth: Payer: Self-pay | Admitting: Licensed Clinical Social Worker

## 2013-07-28 DIAGNOSIS — R52 Pain, unspecified: Secondary | ICD-10-CM

## 2013-07-28 DIAGNOSIS — K219 Gastro-esophageal reflux disease without esophagitis: Secondary | ICD-10-CM

## 2013-07-28 MED ORDER — OMEPRAZOLE 20 MG PO CPDR: 20.0000 mg | DELAYED_RELEASE_CAPSULE | Freq: Every day | ORAL | Status: DC

## 2013-07-28 MED ORDER — OXYCODONE-ACETAMINOPHEN 10-325 MG PO TABS: 1.0000 | ORAL_TABLET | Freq: Four times a day (QID) | ORAL | Status: DC | PRN

## 2013-07-28 NOTE — Telephone Encounter (Signed)
Patient called complaining of esophogeal pain that leads to his stomach, he denies burning or belching up stomach acid. He states this has been going on for a few weeks now and would like to a gastro doctor. Please advise

## 2013-07-28 NOTE — Telephone Encounter (Signed)
I sent in prilosec 20 mg because it is covered under ADAP

## 2013-07-28 NOTE — Telephone Encounter (Signed)
I put in the directions to take 12 hours apart from HIV medications

## 2013-07-28 NOTE — Telephone Encounter (Signed)
He can take pepcid OTC dosed 12 hours apart from his reyataz, norvir and truvada. IF this doesn't work he could go to low dose prilosec 20mg  again dosed 12 hours apart from reyataz, but we also should consider changing his ARV regimen to Prezista, Norvir and Truvada since antacids interfere with absorption of Reyataz. When is his next appt? He is welcome to see GI doc as well, but he needs to understand that if they recommend ANYTHING stronger than pepcid or low dose prilosec such as higher dose PPI we WILL HAVE TO CHANGE HIS ARV, --which is fine but will have to do this

## 2013-07-29 NOTE — Telephone Encounter (Signed)
Excellent

## 2013-07-30 ENCOUNTER — Other Ambulatory Visit (INDEPENDENT_AMBULATORY_CARE_PROVIDER_SITE_OTHER): Payer: Self-pay

## 2013-07-30 ENCOUNTER — Ambulatory Visit: Payer: Self-pay | Admitting: Infectious Disease

## 2013-07-30 ENCOUNTER — Other Ambulatory Visit: Payer: Self-pay | Admitting: *Deleted

## 2013-07-30 DIAGNOSIS — G894 Chronic pain syndrome: Secondary | ICD-10-CM

## 2013-07-30 NOTE — Progress Notes (Signed)
Random UDS according to pain contract

## 2013-07-30 NOTE — Addendum Note (Signed)
Addended bySteva Colder on: 07/30/2013 10:07 AM   Modules accepted: Orders

## 2013-07-30 NOTE — Addendum Note (Signed)
Addended by: Mariea Clonts D on: 07/30/2013 10:07 AM   Modules accepted: Orders

## 2013-07-30 NOTE — Addendum Note (Signed)
Addended by: Mariea Clonts D on: 07/30/2013 10:52 AM   Modules accepted: Orders

## 2013-07-31 LAB — DRUG SCREEN, URINE
Amphetamine Screen, Ur: NEGATIVE
Benzodiazepines.: NEGATIVE
Marijuana Metabolite: POSITIVE — AB
Methadone: NEGATIVE
Opiates: NEGATIVE
Phencyclidine (PCP): NEGATIVE

## 2013-08-04 ENCOUNTER — Other Ambulatory Visit: Payer: Self-pay | Admitting: Licensed Clinical Social Worker

## 2013-08-04 DIAGNOSIS — K219 Gastro-esophageal reflux disease without esophagitis: Secondary | ICD-10-CM

## 2013-08-04 MED ORDER — OMEPRAZOLE 20 MG PO CPDR: 20.0000 mg | DELAYED_RELEASE_CAPSULE | Freq: Every day | ORAL | Status: DC

## 2013-08-26 ENCOUNTER — Other Ambulatory Visit: Payer: Self-pay | Admitting: *Deleted

## 2013-08-26 DIAGNOSIS — R52 Pain, unspecified: Secondary | ICD-10-CM

## 2013-08-26 MED ORDER — OXYCODONE-ACETAMINOPHEN 10-325 MG PO TABS: 1.0000 | ORAL_TABLET | Freq: Four times a day (QID) | ORAL | Status: DC | PRN

## 2013-09-02 ENCOUNTER — Ambulatory Visit: Payer: Self-pay | Admitting: Infectious Disease

## 2013-09-03 ENCOUNTER — Telehealth: Payer: Self-pay | Admitting: *Deleted

## 2013-09-03 ENCOUNTER — Telehealth: Payer: Self-pay | Admitting: Licensed Clinical Social Worker

## 2013-09-03 NOTE — Telephone Encounter (Signed)
Requesting "Tamiflu" be called to [facility] in [place], [place].  Would like a call back after Dr. Daiva Eves is consulted.  [phone]

## 2013-09-03 NOTE — Telephone Encounter (Signed)
RN spoke with Douglas Salazar about pt's request.

## 2013-09-03 NOTE — Telephone Encounter (Signed)
I returned patients call and he said he did not need a tamiflu prescription any longer he went to the Urgent Care and received penicillin.

## 2013-09-09 ENCOUNTER — Ambulatory Visit (INDEPENDENT_AMBULATORY_CARE_PROVIDER_SITE_OTHER): Payer: Self-pay | Admitting: Infectious Disease

## 2013-09-09 ENCOUNTER — Encounter: Payer: Self-pay | Admitting: Infectious Disease

## 2013-09-09 VITALS — BP 143/85 | HR 68 | Temp 97.9°F | Ht 72.0 in | Wt 197.0 lb

## 2013-09-09 DIAGNOSIS — R09A2 Foreign body sensation, throat: Secondary | ICD-10-CM

## 2013-09-09 DIAGNOSIS — R1310 Dysphagia, unspecified: Secondary | ICD-10-CM

## 2013-09-09 DIAGNOSIS — B20 Human immunodeficiency virus [HIV] disease: Secondary | ICD-10-CM

## 2013-09-09 DIAGNOSIS — R0989 Other specified symptoms and signs involving the circulatory and respiratory systems: Secondary | ICD-10-CM | POA: Insufficient documentation

## 2013-09-09 DIAGNOSIS — I25810 Atherosclerosis of coronary artery bypass graft(s) without angina pectoris: Secondary | ICD-10-CM

## 2013-09-09 DIAGNOSIS — Z23 Encounter for immunization: Secondary | ICD-10-CM

## 2013-09-09 DIAGNOSIS — R6889 Other general symptoms and signs: Secondary | ICD-10-CM

## 2013-09-09 NOTE — Patient Instructions (Addendum)
You need to be seen by a GI MD for evaluation with EGD study  PLEASE STOP THE PRILOSEC  LABS IN EARLY OCTOBER

## 2013-09-09 NOTE — Progress Notes (Signed)
Subjective:    Patient ID: Douglas Salazar, male    DOB: 05-25-1971, 42 y.o.   MRN: [id number]  HPI   42 year old transgender male with HIV who also has comorbid CAD who has remain perfectly virologically suppressed on Reyataz Norvir and Truvada.  She has had problems with a sensation of foreign body in her throat since late July was prescribed over-the-counter H2 blocker without relief and then over-the-counter Prilosec 20 mg. My instructions were for this medication to be taken 12 hours apart from her antiretroviral medications but she instead has been taking it concurrently with antiviral therapy which would risk malabsorption of the atazanavir.  Symptom of dysphasia and foreign body sensation in throat is not improving. She has been seen by urgent care and [facility] where she was given oral antibiotics in the form penicillin without any relief. Her viral load was suppressed and her CD4 count was healthy when last checked although this was done prior to the introduction of omeprazole but unfortunately they have the same time as her atazanavir.  She had one main issue today which was rash on dorsum on her arms which is intensely pruritic and which she scratches. I had rx her triamcinolone but she does not appear to have taken it, will send in rx once again.     Review of Systems  Constitutional: Negative for fever, chills, diaphoresis, activity change, appetite change, fatigue and unexpected weight change.  HENT: Positive for trouble swallowing. Negative for congestion, sore throat, rhinorrhea, sneezing and sinus pressure.   Eyes: Negative for photophobia and visual disturbance.  Respiratory: Negative for cough, chest tightness, shortness of breath, wheezing and stridor.   Cardiovascular: Negative for chest pain, palpitations and leg swelling.  Gastrointestinal: Negative for nausea, vomiting, abdominal pain, diarrhea, constipation, blood in stool, abdominal distention and anal  bleeding.  Genitourinary: Negative for dysuria, hematuria, flank pain and difficulty urinating.  Musculoskeletal: Negative for myalgias, back pain, joint swelling, arthralgias and gait problem.  Skin: Negative for color change, pallor, rash and wound.  Neurological: Positive for numbness. Negative for dizziness, tremors, weakness and light-headedness.  Hematological: Negative for adenopathy. Does not bruise/bleed easily.  Psychiatric/Behavioral: Negative for behavioral problems, confusion, sleep disturbance, dysphoric mood, decreased concentration and agitation.       Objective:   Physical Exam  Constitutional: He is oriented to person, place, and time. He appears well-developed and well-nourished. No distress.  HENT:  Head: Normocephalic and atraumatic.  Mouth/Throat: Oropharynx is clear and moist. No oropharyngeal exudate, posterior oropharyngeal edema, posterior oropharyngeal erythema or tonsillar abscesses.  Eyes: Conjunctivae and EOM are normal. Pupils are equal, round, and reactive to light.  Neck: Normal range of motion. Neck supple.  Cardiovascular: Normal rate and regular rhythm.   Pulmonary/Chest: Effort normal. No respiratory distress.  Abdominal: He exhibits no distension.  Musculoskeletal: He exhibits no edema and no tenderness.  Neurological: He is alert and oriented to person, place, and time. He has normal reflexes. Coordination normal.  Skin: Skin is warm and dry. He is not diaphoretic. No erythema. No pallor.  Psychiatric: He has a normal mood and affect. His behavior is normal. Judgment and thought content normal.          Assessment & Plan:   HIV: DC the omeprazole IMMEDIATELY, recheck viral load and CD4 count in October and hopefully he has not required resistance to atazanavir or Truvada backbone.  Foreign body sensation with dysphagia: She needs to be referred to urology for consideration of EGD. Is  no thrush on exam and the patient should not be at risk for  herpes or cytomegalovirus esophagitis.   CAD see above re risks of estrogen therapy. I have written for higher dose of lipitor and she does claim to be taking this. Will check LDL today  Obesity: tremendous job losing weight and continues to do so  Smoking: she quit smoking and that is a highly commendable thing that has acted to reduce risk here  Neuropathic pain: Continue gabapentin.

## 2013-09-10 ENCOUNTER — Telehealth: Payer: Self-pay | Admitting: Infectious Disease

## 2013-09-10 DIAGNOSIS — B20 Human immunodeficiency virus [HIV] disease: Secondary | ICD-10-CM

## 2013-09-10 NOTE — Telephone Encounter (Signed)
Spoke with patient and he will come in on Monday 9/15/4 for repeat labs. He was unable to come before then. Douglas Salazar

## 2013-09-10 NOTE — Telephone Encounter (Signed)
Douglas Salazar I have thought more about Douglas Salazar and I would prefer to bring him back for HIV RNA reflex to genotype and CD4. THere is a chance his prilosec may have caused underexposure of his reyataz and resistance to this and his backbone. I would like him to come in at today or tomorrow and have these tests run WHILE he is still taking the meds.  After he has the tests collected he should stop his current HIV medicines until we know the results of these tests. Hopefully he does not have any resistance but I am worried with MORE than a month of taking prilosec

## 2013-09-10 NOTE — Telephone Encounter (Signed)
He understands Thanks, Annice Pih

## 2013-09-10 NOTE — Telephone Encounter (Signed)
OK my apologies for not asking for them yesterday. He should stay on his meds until Monday when we do the bloodwork

## 2013-09-10 NOTE — Telephone Encounter (Signed)
Thanks

## 2013-09-14 ENCOUNTER — Ambulatory Visit: Payer: Self-pay | Admitting: Infectious Disease

## 2013-09-14 ENCOUNTER — Other Ambulatory Visit (INDEPENDENT_AMBULATORY_CARE_PROVIDER_SITE_OTHER): Payer: Self-pay

## 2013-09-14 DIAGNOSIS — Z79899 Other long term (current) drug therapy: Secondary | ICD-10-CM

## 2013-09-14 DIAGNOSIS — B20 Human immunodeficiency virus [HIV] disease: Secondary | ICD-10-CM

## 2013-09-14 DIAGNOSIS — Z113 Encounter for screening for infections with a predominantly sexual mode of transmission: Secondary | ICD-10-CM

## 2013-09-14 LAB — CBC WITH DIFFERENTIAL/PLATELET
Basophils Absolute: 0 10*3/uL (ref 0.0–0.1)
Basophils Relative: 0 % (ref 0–1)
HCT: 42.6 % (ref 39.0–52.0)
Hemoglobin: 14.7 g/dL (ref 13.0–17.0)
Lymphocytes Relative: 33 % (ref 12–46)
MCHC: 34.5 g/dL (ref 30.0–36.0)
Monocytes Absolute: 0.9 10*3/uL (ref 0.1–1.0)
Monocytes Relative: 6 % (ref 3–12)
Neutro Abs: 8.7 10*3/uL — ABNORMAL HIGH (ref 1.7–7.7)
Neutrophils Relative %: 61 % (ref 43–77)
WBC: 14.4 10*3/uL — ABNORMAL HIGH (ref 4.0–10.5)

## 2013-09-15 ENCOUNTER — Other Ambulatory Visit: Payer: Self-pay | Admitting: *Deleted

## 2013-09-15 ENCOUNTER — Telehealth: Payer: Self-pay | Admitting: *Deleted

## 2013-09-15 DIAGNOSIS — R1310 Dysphagia, unspecified: Secondary | ICD-10-CM

## 2013-09-15 LAB — LIPID PANEL
HDL: 33 mg/dL — ABNORMAL LOW (ref >39)
LDL Cholesterol: 62 mg/dL (ref 0–99)
Triglycerides: 180 mg/dL — ABNORMAL HIGH (ref <150)
VLDL: 36 mg/dL (ref 0–40)

## 2013-09-15 LAB — COMPLETE METABOLIC PANEL WITH GFR
AST: 15 U/L (ref 0–37)
Albumin: 4.4 g/dL (ref 3.5–5.2)
Alkaline Phosphatase: 139 U/L — ABNORMAL HIGH (ref 39–117)
Calcium: 10.1 mg/dL (ref 8.4–10.5)
Chloride: 105 mEq/L (ref 96–112)
Glucose, Bld: 109 mg/dL — ABNORMAL HIGH (ref 70–99)
Potassium: 4.8 mEq/L (ref 3.5–5.3)
Sodium: 138 mEq/L (ref 135–145)
Total Protein: 7.8 g/dL (ref 6.0–8.3)

## 2013-09-15 LAB — T-HELPER CELL (CD4) - (RCID CLINIC ONLY): CD4 T Cell Abs: 1420 /uL (ref 400–2700)

## 2013-09-15 LAB — HIV-1 RNA ULTRAQUANT REFLEX TO GENTYP+
HIV 1 RNA Quant: 25 copies/mL — ABNORMAL HIGH (ref <20)
HIV-1 RNA Quant, Log: 1.4 {Log} — ABNORMAL HIGH (ref <1.30)

## 2013-09-15 NOTE — Telephone Encounter (Signed)
Patient came in for lab work yesterday per our phone conversation on Friday 09/11/13, Dr. Daiva Eves had requested that he come in for lab work. He became verbally abusive in the hall way in front of other patient's. Alesia Morin, witnessed this and suggested we go to a room to discuss; this appeared to enrage patient further and he stated I should never call his home again. It seems when we spoke on Friday I mentioned the word HIV and he screamed, "I know I have HIV and I don't need to be reminded." He again said, Do not ever call me again. When I spoke to him Friday it was a call back from him returning my call regarding lab work. He had several questions regarding an EGD that Dr. Daiva Eves wanted to order. I asked him why he spoke to me Friday and was fine on the phone, because if it was a bad time he should have told me to call back. However, he kept me on the phone for at least 15 minutes with multiple questions. Due to the tone and harassing nature of the encounter I spoke with office manager, Pieter Partridge and requested to go home. She agreed. Wendall Mola CMA

## 2013-09-15 NOTE — Telephone Encounter (Signed)
Thank you Dr. Daiva Eves Wendall Mola

## 2013-09-15 NOTE — Telephone Encounter (Signed)
I am very sorry that this happened not appropriate from [place] at all. There is NO excuse for such behavior. If it happens again I would vote to dismiss him from the clinic.  While  he has done an excellent job taking care of his HIV stopping smoking, losing weight etc but he has significant personality problems and I am very very sorry he acted this way. We will not tolerate this behavior from him in future

## 2013-09-17 ENCOUNTER — Ambulatory Visit: Payer: Self-pay

## 2013-09-28 ENCOUNTER — Other Ambulatory Visit: Payer: Self-pay | Admitting: *Deleted

## 2013-09-28 DIAGNOSIS — R52 Pain, unspecified: Secondary | ICD-10-CM

## 2013-09-28 MED ORDER — OXYCODONE-ACETAMINOPHEN 10-325 MG PO TABS: 1.0000 | ORAL_TABLET | Freq: Four times a day (QID) | ORAL | Status: DC | PRN

## 2013-09-30 ENCOUNTER — Ambulatory Visit: Payer: Self-pay | Admitting: Infectious Disease

## 2013-10-20 ENCOUNTER — Telehealth: Payer: Self-pay | Admitting: Licensed Clinical Social Worker

## 2013-10-20 NOTE — Telephone Encounter (Signed)
I thought about that, I was going to attempt to ask him questions on the phone but when I looked at the forms it would have taken more time than I could offer him today. I can try to do it on Friday when it's not so busy.

## 2013-10-20 NOTE — Telephone Encounter (Signed)
Ok I will check with your schedule and see what the best day is.

## 2013-10-20 NOTE — Telephone Encounter (Signed)
We can do it with a visit but he would do well to come up with his explanations ahead of the visit since he will be overbookded for it

## 2013-10-20 NOTE — Telephone Encounter (Signed)
Either that or he will have to explicitly state over the phone how he is disabled. Ie what he can and cannot do on the form and why

## 2013-10-20 NOTE — Telephone Encounter (Signed)
Patient stated that the form is for disability he had the same form filled out  at a visit with Dr. Daiva Eves last year.It was never scanned. I offered him an appointment but there were no available appointments and he needs the form by 11/24.  Can patient be overbooked around the first week of November?

## 2013-10-26 ENCOUNTER — Other Ambulatory Visit: Payer: Self-pay | Admitting: Licensed Clinical Social Worker

## 2013-10-26 DIAGNOSIS — R52 Pain, unspecified: Secondary | ICD-10-CM

## 2013-10-26 MED ORDER — OXYCODONE-ACETAMINOPHEN 10-325 MG PO TABS: 1.0000 | ORAL_TABLET | Freq: Four times a day (QID) | ORAL | Status: DC | PRN

## 2013-10-30 ENCOUNTER — Ambulatory Visit (INDEPENDENT_AMBULATORY_CARE_PROVIDER_SITE_OTHER): Payer: Self-pay | Admitting: Infectious Disease

## 2013-10-30 ENCOUNTER — Other Ambulatory Visit: Payer: Self-pay | Admitting: Licensed Clinical Social Worker

## 2013-10-30 ENCOUNTER — Encounter: Payer: Self-pay | Admitting: Infectious Disease

## 2013-10-30 VITALS — BP 119/84 | HR 67 | Temp 98.1°F | Wt 200.0 lb

## 2013-10-30 DIAGNOSIS — R42 Dizziness and giddiness: Secondary | ICD-10-CM

## 2013-10-30 DIAGNOSIS — R5381 Other malaise: Secondary | ICD-10-CM

## 2013-10-30 DIAGNOSIS — G63 Polyneuropathy in diseases classified elsewhere: Secondary | ICD-10-CM

## 2013-10-30 DIAGNOSIS — Z23 Encounter for immunization: Secondary | ICD-10-CM

## 2013-10-30 DIAGNOSIS — I2510 Atherosclerotic heart disease of native coronary artery without angina pectoris: Secondary | ICD-10-CM

## 2013-10-30 DIAGNOSIS — B20 Human immunodeficiency virus [HIV] disease: Secondary | ICD-10-CM

## 2013-10-30 DIAGNOSIS — F1911 Other psychoactive substance abuse, in remission: Secondary | ICD-10-CM

## 2013-10-30 DIAGNOSIS — K219 Gastro-esophageal reflux disease without esophagitis: Secondary | ICD-10-CM

## 2013-10-30 MED ORDER — OMEPRAZOLE 20 MG PO CPDR: 20.0000 mg | DELAYED_RELEASE_CAPSULE | Freq: Every day | ORAL | Status: DC

## 2013-10-30 NOTE — Progress Notes (Signed)
Subjective:    Patient ID: Douglas Salazar, male    DOB: 05/23/1971, 42 y.o.   MRN: [id number]  HPI   42 year old transgender male with HIV who also has comorbid CAD who has remain perfectly virologically suppressed on Reyataz Norvir and Truvada.  [name] came in today to have disability paperwork filled out in addition to routine clinic visit.  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  She additionally has trouble sitting for more than 30 minutes without needing to move to a new position or lie down due to this painful neuropathy.  The neuropathy the as well as dyspnea on exertion but she believes is due to her underlying coronary disease also make it difficulty for her to walk more than half a block without getting out of breath. She also suffers from distracted this particularly due to the pain from the neuropathy and also do to some depressive symptoms.  He is currently not using any recreational drugs and having stopped tobacco more than a year ago now having stopped marijuana recently as well and having been without alcohol for some time as well.  Depressive symptoms have improved though again painful neuropathy and other symptoms including some intermittent dizziness have been bothering her and making her feel fatigued at times.        Review of Systems  Constitutional: Positive for activity change and fatigue. Negative for fever, chills, diaphoresis, appetite change and unexpected weight change.  HENT: Negative for congestion, rhinorrhea, sinus pressure, sneezing, sore throat and trouble swallowing.   Eyes: Negative for photophobia and visual disturbance.  Respiratory: Negative for cough, chest tightness, shortness of breath, wheezing and stridor.   Cardiovascular: Negative for chest pain, palpitations and leg swelling.   Gastrointestinal: Negative for nausea, vomiting, abdominal pain, diarrhea, constipation, blood in stool, abdominal distention and anal bleeding.  Genitourinary: Negative for dysuria, hematuria, flank pain and difficulty urinating.  Musculoskeletal: Positive for gait problem. Negative for arthralgias, back pain, joint swelling and myalgias.  Skin: Negative for color change, pallor, rash and wound.  Neurological: Positive for numbness. Negative for dizziness, tremors, weakness and light-headedness.  Hematological: Negative for adenopathy. Does not bruise/bleed easily.  Psychiatric/Behavioral: Positive for decreased concentration. Negative for behavioral problems, confusion, sleep disturbance, dysphoric mood and agitation.       Objective:   Physical Exam  Constitutional: He is oriented to person, place, and time. He appears well-developed and well-nourished. No distress.  HENT:  Head: Normocephalic and atraumatic.  Mouth/Throat: Oropharynx is clear and moist. No oropharyngeal exudate, posterior oropharyngeal edema, posterior oropharyngeal erythema or tonsillar abscesses.  Eyes: Conjunctivae and EOM are normal. Pupils are equal, round, and reactive to light.  Neck: Normal range of motion. Neck supple.  Cardiovascular: Normal rate and regular rhythm.   Pulmonary/Chest: Effort normal. No respiratory distress.  Abdominal: He exhibits no distension.  Musculoskeletal: He exhibits no edema and no tenderness.  Neurological: He is alert and oriented to person, place, and time. He has normal reflexes. Coordination normal.  Skin: Skin is warm and dry. He is not diaphoretic. No erythema. No pallor.  Psychiatric: He has a normal mood and affect. His behavior is normal. Judgment and thought content normal.          Assessment & Plan:  HIV: continue reyataz, norvir and truvada, recheck labs in 4 months and renew ADAP  Painful neuropathy: continue gabapentin and oxycodone. I have filled out  disability paperwork for her documented her painful neuropathy and her dyspnea on exertion dizziness difficulty concentrating all of which make it not possible for her to work at present.  I spent greater than 45 minutes with the patient including greater than 50% of time in face to face counsel of the patient and in coordination of their care.   CAD at goal with LDL and on lipitor, Given that I dont think she will get much benefit in terms of gynecomastia from aldactone adn given that she would benefit from being a beta blocker I would propose to her cardiologist that we take her off of the Spiriva lactone and place her instead back on low-dose metoprolol., ASA Obesity: tremendous job losing weight and continues to do so  Smoking: she quit smoking and that is a highly commendable thing that has acted to reduce risk here   Dizziness: ON ortho static vital signs the systolic blood pressure did drop slightly. However she did not have worsening dizziness symptoms and her blood pressure still was at goal. As above I would recommend stopping the Spiriva lactone and substituting back low-dose metoprolol dose.   HCM: flu shot uptodate

## 2013-11-30 ENCOUNTER — Other Ambulatory Visit: Payer: Self-pay | Admitting: *Deleted

## 2013-11-30 DIAGNOSIS — R52 Pain, unspecified: Secondary | ICD-10-CM

## 2013-11-30 MED ORDER — OXYCODONE-ACETAMINOPHEN 10-325 MG PO TABS: 1.0000 | ORAL_TABLET | Freq: Four times a day (QID) | ORAL | Status: DC | PRN

## 2013-12-21 ENCOUNTER — Other Ambulatory Visit: Payer: Self-pay | Admitting: Licensed Clinical Social Worker

## 2013-12-21 ENCOUNTER — Other Ambulatory Visit: Payer: Self-pay | Admitting: *Deleted

## 2013-12-21 DIAGNOSIS — B20 Human immunodeficiency virus [HIV] disease: Secondary | ICD-10-CM

## 2013-12-21 DIAGNOSIS — R52 Pain, unspecified: Secondary | ICD-10-CM

## 2013-12-21 DIAGNOSIS — R21 Rash and other nonspecific skin eruption: Secondary | ICD-10-CM

## 2013-12-21 MED ORDER — OXYCODONE-ACETAMINOPHEN 10-325 MG PO TABS: 1.0000 | ORAL_TABLET | Freq: Four times a day (QID) | ORAL | Status: DC | PRN

## 2013-12-21 MED ORDER — EMTRICITABINE-TENOFOVIR DF 200-300 MG PO TABS: 1.0000 | ORAL_TABLET | Freq: Every day | ORAL | Status: DC

## 2013-12-21 MED ORDER — ATAZANAVIR SULFATE 300 MG PO CAPS: 300.0000 mg | ORAL_CAPSULE | Freq: Every day | ORAL | Status: DC

## 2013-12-21 MED ORDER — RITONAVIR 100 MG PO TABS: 100.0000 mg | ORAL_TABLET | Freq: Every day | ORAL | Status: DC

## 2013-12-21 MED ORDER — TRIAMCINOLONE ACETONIDE 0.5 % EX OINT: TOPICAL_OINTMENT | Freq: Two times a day (BID) | CUTANEOUS | Status: AC

## 2013-12-22 ENCOUNTER — Other Ambulatory Visit: Payer: Self-pay | Admitting: Licensed Clinical Social Worker

## 2013-12-22 DIAGNOSIS — R52 Pain, unspecified: Secondary | ICD-10-CM

## 2013-12-22 MED ORDER — OXYCODONE-ACETAMINOPHEN 10-325 MG PO TABS: 1.0000 | ORAL_TABLET | Freq: Four times a day (QID) | ORAL | Status: DC | PRN

## 2014-01-04 ENCOUNTER — Other Ambulatory Visit: Payer: Self-pay

## 2014-01-18 ENCOUNTER — Ambulatory Visit: Payer: Self-pay | Admitting: Infectious Disease

## 2014-01-21 ENCOUNTER — Other Ambulatory Visit: Payer: Self-pay | Admitting: *Deleted

## 2014-01-21 DIAGNOSIS — R52 Pain, unspecified: Secondary | ICD-10-CM

## 2014-01-21 MED ORDER — OXYCODONE-ACETAMINOPHEN 10-325 MG PO TABS: 1.0000 | ORAL_TABLET | Freq: Four times a day (QID) | ORAL | Status: DC | PRN

## 2014-02-16 ENCOUNTER — Other Ambulatory Visit: Payer: Self-pay

## 2014-02-16 DIAGNOSIS — R52 Pain, unspecified: Secondary | ICD-10-CM

## 2014-02-16 MED ORDER — OXYCODONE-ACETAMINOPHEN 10-325 MG PO TABS: 1.0000 | ORAL_TABLET | Freq: Four times a day (QID) | ORAL | Status: DC | PRN

## 2014-02-16 NOTE — Telephone Encounter (Signed)
Patient is calling for script . He would like to pick it up on Friday, February 19, 2014.  [place] this is okay.   He will need to perform Opiate and urine drug screen at time of pick up.

## 2014-02-17 NOTE — Telephone Encounter (Signed)
Returning pt's call.  Wanting to set up appt for ADAP renewal.  Connected pt to L. Goins to set up appt.

## 2014-02-19 ENCOUNTER — Ambulatory Visit: Payer: 59

## 2014-02-19 ENCOUNTER — Other Ambulatory Visit (INDEPENDENT_AMBULATORY_CARE_PROVIDER_SITE_OTHER): Payer: 59

## 2014-02-19 ENCOUNTER — Other Ambulatory Visit: Payer: Self-pay | Admitting: Infectious Disease

## 2014-02-19 ENCOUNTER — Ambulatory Visit: Payer: Self-pay | Admitting: *Deleted

## 2014-02-19 DIAGNOSIS — F1110 Opioid abuse, uncomplicated: Secondary | ICD-10-CM

## 2014-02-19 DIAGNOSIS — B20 Human immunodeficiency virus [HIV] disease: Secondary | ICD-10-CM

## 2014-02-19 DIAGNOSIS — F1190 Opioid use, unspecified, uncomplicated: Secondary | ICD-10-CM

## 2014-02-19 DIAGNOSIS — Z79899 Other long term (current) drug therapy: Secondary | ICD-10-CM

## 2014-02-19 LAB — COMPLETE METABOLIC PANEL WITH GFR
ALBUMIN: 4.5 g/dL (ref 3.5–5.2)
ALK PHOS: 117 U/L (ref 39–117)
ALT: 28 U/L (ref 0–53)
AST: 23 U/L (ref 0–37)
BUN: 12 mg/dL (ref 6–23)
CHLORIDE: 102 meq/L (ref 96–112)
CO2: 26 mEq/L (ref 19–32)
Calcium: 9.6 mg/dL (ref 8.4–10.5)
Creat: 0.83 mg/dL (ref 0.50–1.35)
GFR, Est African American: >89 mL/min
Glucose, Bld: 89 mg/dL (ref 70–99)
Potassium: 4.8 mEq/L (ref 3.5–5.3)
SODIUM: 137 meq/L (ref 135–145)
TOTAL PROTEIN: 7.7 g/dL (ref 6.0–8.3)
Total Bilirubin: 2.6 mg/dL — ABNORMAL HIGH (ref 0.2–1.2)

## 2014-02-19 LAB — CBC WITH DIFFERENTIAL/PLATELET
BASOS ABS: 0 10*3/uL (ref 0.0–0.1)
BASOS PCT: 0 % (ref 0–1)
Eosinophils Absolute: 0.1 10*3/uL (ref 0.0–0.7)
Eosinophils Relative: 1 % (ref 0–5)
HEMATOCRIT: 47.1 % (ref 39.0–52.0)
Hemoglobin: 15.9 g/dL (ref 13.0–17.0)
Lymphocytes Relative: 41 % (ref 12–46)
Lymphs Abs: 4.5 10*3/uL — ABNORMAL HIGH (ref 0.7–4.0)
MCH: 29.7 pg (ref 26.0–34.0)
MCHC: 33.8 g/dL (ref 30.0–36.0)
MCV: 87.9 fL (ref 78.0–100.0)
MONO ABS: 1 10*3/uL (ref 0.1–1.0)
Monocytes Relative: 9 % (ref 3–12)
NEUTROS ABS: 5.4 10*3/uL (ref 1.7–7.7)
Neutrophils Relative %: 49 % (ref 43–77)
Platelets: 255 10*3/uL (ref 150–400)
RBC: 5.36 MIL/uL (ref 4.22–5.81)
RDW: 14.7 % (ref 11.5–15.5)
WBC: 11 10*3/uL — ABNORMAL HIGH (ref 4.0–10.5)

## 2014-02-19 LAB — LIPID PANEL
Cholesterol: 131 mg/dL (ref 0–200)
HDL: 33 mg/dL — ABNORMAL LOW (ref >39)
LDL Cholesterol: 53 mg/dL (ref 0–99)
Total CHOL/HDL Ratio: 4 Ratio
Triglycerides: 224 mg/dL — ABNORMAL HIGH (ref <150)
VLDL: 45 mg/dL — ABNORMAL HIGH (ref 0–40)

## 2014-02-19 LAB — T-HELPER CELL (CD4) - (RCID CLINIC ONLY)
CD4 % Helper T Cell: 28 % — ABNORMAL LOW (ref 33–55)
CD4 T Cell Abs: 1430 /uL (ref 400–2700)

## 2014-02-20 LAB — DRUG SCREEN, URINE
Amphetamine Screen, Ur: NEGATIVE
Barbiturate Quant, Ur: NEGATIVE
Benzodiazepines.: NEGATIVE
Cocaine Metabolites: NEGATIVE
Creatinine,U: 111.61 mg/dL
METHADONE: NEGATIVE
Marijuana Metabolite: NEGATIVE
OPIATES: POSITIVE — AB
PROPOXYPHENE: NEGATIVE
Phencyclidine (PCP): NEGATIVE

## 2014-02-22 LAB — HIV-1 RNA QUANT-NO REFLEX-BLD: HIV 1 RNA Quant: <20 copies/mL (ref <20)

## 2014-02-24 ENCOUNTER — Other Ambulatory Visit: Payer: Self-pay | Admitting: *Deleted

## 2014-02-24 DIAGNOSIS — B20 Human immunodeficiency virus [HIV] disease: Secondary | ICD-10-CM

## 2014-02-24 LAB — OPIATES/OPIOIDS (LC/MS-MS)
Codeine Urine: NEGATIVE ng/mL
HYDROMORPHONE: NEGATIVE ng/mL
Heroin (6-AM), UR: NEGATIVE ng/mL
Hydrocodone: NEGATIVE ng/mL
Morphine Urine: NEGATIVE ng/mL
NOROXYCODONE, UR: 1932 ng/mL
Norhydrocodone, Ur: NEGATIVE ng/mL
OXYCODONE, UR: 1993 ng/mL
OXYMORPHONE, URINE: 1807 ng/mL

## 2014-02-24 MED ORDER — RITONAVIR 100 MG PO TABS: 100.0000 mg | ORAL_TABLET | Freq: Every day | ORAL | Status: DC

## 2014-02-24 MED ORDER — ATAZANAVIR SULFATE 300 MG PO CAPS: 300.0000 mg | ORAL_CAPSULE | Freq: Every day | ORAL | Status: DC

## 2014-02-24 MED ORDER — EMTRICITABINE-TENOFOVIR DF 200-300 MG PO TABS: 1.0000 | ORAL_TABLET | Freq: Every day | ORAL | Status: DC

## 2014-02-25 ENCOUNTER — Other Ambulatory Visit: Payer: Self-pay

## 2014-02-26 ENCOUNTER — Encounter: Payer: Self-pay | Admitting: Internal Medicine

## 2014-02-26 ENCOUNTER — Ambulatory Visit (INDEPENDENT_AMBULATORY_CARE_PROVIDER_SITE_OTHER): Payer: 59 | Admitting: Internal Medicine

## 2014-02-26 VITALS — BP 120/78 | HR 76 | Temp 97.6°F | Ht 70.0 in | Wt 196.8 lb

## 2014-02-26 DIAGNOSIS — Z0000 Encounter for general adult medical examination without abnormal findings: Secondary | ICD-10-CM

## 2014-02-26 DIAGNOSIS — H538 Other visual disturbances: Secondary | ICD-10-CM

## 2014-02-26 NOTE — Progress Notes (Signed)
Pre visit review using our clinic review tool, if applicable. No additional management support is needed unless otherwise documented below in the visit note. 

## 2014-02-26 NOTE — Progress Notes (Signed)
HPI Pt presents to the clinic today to establish care. He does not have a PCP. He does have an ID doctor and cardiologist He has no concerns today.  Flu: 08/2013 Pneumovax: 2011 Eye Doctor: as needed (needs referral to [facility] eye clinic) Dentist: biannually  Past Medical History  Diagnosis Date  . AIDS   . 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 CAD (coronary artery disease)   . Dysphagia   . Hyperlipidemia   . Myocardial infarction   . Neuromuscular disorder   . Substance abuse   . GERD (gastroesophageal reflux disease)   . Depression   . Hypertension   . Anxiety     Current Outpatient Prescriptions  Medication Sig Dispense Refill  . atazanavir (REYATAZ) 300 MG capsule Take 1 capsule (300 mg total) by mouth daily with breakfast.  30 capsule  6  . atorvastatin (LIPITOR) 40 MG tablet Take 1 tablet (40 mg total) by mouth daily.  30 tablet  11  . emtricitabine-tenofovir (TRUVADA) 200-300 MG per tablet Take 1 tablet by mouth daily.  30 tablet  6  . gabapentin (NEURONTIN) 800 MG tablet Take 1 tablet (800 mg total) by mouth 3 (three) times daily.  90 tablet  11  . oxyCODONE-acetaminophen (PERCOCET) 10-325 MG per tablet Take 1 tablet by mouth every 6 (six) hours as needed for pain (every 6 to 8 hours as needed for pain).  115 tablet  0  . ritonavir (NORVIR) 100 MG TABS tablet Take 1 tablet (100 mg total) by mouth daily with breakfast.  30 tablet  6  . spironolactone (ALDACTONE) 25 MG tablet Take 1 tablet (25 mg total) by mouth daily.  90 tablet  3  . triamcinolone ointment (KENALOG) 0.5 % Apply topically 2 (two) times daily.  30 g  4   No current facility-administered medications for this visit.    Allergies  Allergen Reactions  . Naproxen   . Sulfonamide Derivatives     Family History  Problem Relation Age of Onset  . Hypertension Mother   . Heart disease Father   . Hypertension Father   . Hearing loss Paternal Grandfather   . Cancer Neg Hx   . Diabetes Neg Hx    . Stroke Neg Hx     History   Social History  . Marital Status: Single    Spouse Name: N/A    Number of Children: N/A  . Years of Education: N/A   Occupational History  . Not on file.   Social History Main Topics  . Smoking status: Former Smoker    Quit date: 05/31/2012  . Smokeless tobacco: Never Used     Comment: pt no longer smokes  . Alcohol Use: No  . Drug Use: No  . Sexual Activity: Not Currently     Comment: pt. declined condoms   Other Topics Concern  . Not on file   Social History Narrative  . No narrative on file    ROS:  Constitutional: Denies fever, malaise, fatigue, headache or abrupt weight changes.  HEENT: Pt reports blurred vision. Denies eye pain, eye redness, ear pain, ringing in the ears, wax buildup, runny nose, nasal congestion, bloody nose, or sore throat. Respiratory: Denies difficulty breathing, shortness of breath, cough or sputum production.   Cardiovascular: Denies chest pain, chest tightness, palpitations or swelling in the hands or feet.  Gastrointestinal: Denies abdominal pain, bloating, constipation, diarrhea or blood in the stool.  GU: Denies frequency, urgency, pain with urination, blood in  urine, odor or discharge. Musculoskeletal: Denies decrease in range of motion, difficulty with gait, muscle pain or joint pain and swelling.  Skin: Denies redness, rashes, lesions or ulcercations.  Neurological: Pt reports neuropathy in his hands and feet. Denies dizziness, difficulty with memory, difficulty with speech or problems with balance and coordination.   No other specific complaints in a complete review of systems (except as listed in HPI above).  PE:  BP 120/78  Pulse 76  Temp(Src) 97.6 F (36.4 C) (Oral)  Ht 5\' 10"  (1.778 m)  Wt 196 lb 12 oz (89.245 kg)  BMI 28.23 kg/m2  SpO2 98% Wt Readings from Last 3 Encounters:  02/26/14 196 lb 12 oz (89.245 kg)  10/30/13 200 lb (90.719 kg)  09/09/13 197 lb (89.359 kg)    General:  Appears his stated age, well developed, well nourished in NAD. HEENT: Head: normal shape and size; Eyes: sclera white, no icterus, conjunctiva pink, PERRLA and EOMs intact; Ears: Tm's gray and intact, normal light reflex; Nose: mucosa pink and moist, septum midline; Throat/Mouth: Teeth present, mucosa pink and moist, no lesions or ulcerations noted.  Neck: Normal range of motion. Neck supple, trachea midline. No massses, lumps or thyromegaly present.  Cardiovascular: Normal rate and rhythm. S1,S2 noted.  No murmur, rubs or gallops noted. No JVD or BLE edema. No carotid bruits noted. Pulmonary/Chest: Normal effort and positive vesicular breath sounds. No respiratory distress. No wheezes, rales or ronchi noted.  Abdomen: Soft and nontender. Normal bowel sounds, no bruits noted. No distention or masses noted. Liver, spleen and kidneys non palpable. Musculoskeletal: Normal range of motion. No signs of joint swelling. No difficulty with gait.  Neurological: Alert and oriented. Cranial nerves II-XII intact. Coordination normal. +DTRs bilaterally. Psychiatric: Mood and affect normal. Behavior is normal. Judgment and thought content normal.     BMET    Component Value Date/Time   NA 137 02/19/2014 0919   K 4.8 02/19/2014 0919   CL 102 02/19/2014 0919   CO2 26 02/19/2014 0919   GLUCOSE 89 02/19/2014 0919   GLUCOSE 77 12/08/2009   BUN 12 02/19/2014 0919   CREATININE 0.83 02/19/2014 0919   CREATININE 0.73 08/13/2011 0540   CALCIUM 9.6 02/19/2014 0919   GFRNONAA >60 08/13/2011 0540   GFRAA >60 08/13/2011 0540    Lipid Panel     Component Value Date/Time   CHOL 131 02/19/2014 0919   TRIG 224* 02/19/2014 0919   HDL 33* 02/19/2014 0919   CHOLHDL 4.0 02/19/2014 0919   VLDL 45* 02/19/2014 0919   LDLCALC 53 02/19/2014 0919    CBC    Component Value Date/Time   WBC 11.0* 02/19/2014 0919   RBC 5.36 02/19/2014 0919   HGB 15.9 02/19/2014 0919   HCT 47.1 02/19/2014 0919   PLT 255 02/19/2014 0919   MCV 87.9  02/19/2014 0919   MCH 29.7 02/19/2014 0919   MCHC 33.8 02/19/2014 0919   RDW 14.7 02/19/2014 0919   LYMPHSABS 4.5* 02/19/2014 0919   MONOABS 1.0 02/19/2014 0919   EOSABS 0.1 02/19/2014 0919   BASOSABS 0.0 02/19/2014 0919    Hgb A1C Lab Results  Component Value Date   HGBA1C 5.9* 08/10/2011     Assessment and Plan:  Prevent Health Maintenance:  Encouraged him to work on diet and exercise Labs reviewed today- high triglycerides Will put in opthalmology referral per pt request  RTC in 1 year or sooner if needed

## 2014-02-26 NOTE — Patient Instructions (Addendum)

## 2014-03-03 ENCOUNTER — Encounter: Payer: 59 | Admitting: Internal Medicine

## 2014-03-09 ENCOUNTER — Ambulatory Visit (INDEPENDENT_AMBULATORY_CARE_PROVIDER_SITE_OTHER): Payer: 59 | Admitting: Infectious Disease

## 2014-03-09 ENCOUNTER — Encounter: Payer: Self-pay | Admitting: Infectious Disease

## 2014-03-09 VITALS — BP 127/78 | HR 82 | Temp 97.8°F | Wt 203.0 lb

## 2014-03-09 DIAGNOSIS — G909 Disorder of the autonomic nervous system, unspecified: Secondary | ICD-10-CM

## 2014-03-09 DIAGNOSIS — R52 Pain, unspecified: Secondary | ICD-10-CM

## 2014-03-09 DIAGNOSIS — G63 Polyneuropathy in diseases classified elsewhere: Secondary | ICD-10-CM

## 2014-03-09 DIAGNOSIS — B20 Human immunodeficiency virus [HIV] disease: Secondary | ICD-10-CM

## 2014-03-09 DIAGNOSIS — E785 Hyperlipidemia, unspecified: Secondary | ICD-10-CM

## 2014-03-09 DIAGNOSIS — I2510 Atherosclerotic heart disease of native coronary artery without angina pectoris: Secondary | ICD-10-CM

## 2014-03-09 MED ORDER — OXYCODONE-ACETAMINOPHEN 10-325 MG PO TABS: 1.0000 | ORAL_TABLET | Freq: Four times a day (QID) | ORAL | Status: DC | PRN

## 2014-03-09 MED ORDER — ATORVASTATIN CALCIUM 20 MG PO TABS: 20.0000 mg | ORAL_TABLET | Freq: Every day | ORAL | Status: DC

## 2014-03-09 MED ORDER — EMTRICITABINE-TENOFOVIR DF 200-300 MG PO TABS: 1.0000 | ORAL_TABLET | Freq: Every day | ORAL | Status: DC

## 2014-03-09 MED ORDER — DOLUTEGRAVIR SODIUM 50 MG PO TABS: 50.0000 mg | ORAL_TABLET | Freq: Every day | ORAL | Status: DC

## 2014-03-09 NOTE — Progress Notes (Signed)
  Subjective:    Patient ID: Douglas Salazar, male    DOB: [birth year], 43 y.o.   MRN: [id number]  HPI   43 year old transgender male with HIV who also has comorbid CAD who has remain perfectly virologically suppressed on Reyataz Norvir and Truvada.  We talked today about another regimen and we opted ultimately for Tivicay and Truvada.      Review of Systems  Constitutional: Positive for activity change and fatigue. Negative for fever, chills, diaphoresis, appetite change and unexpected weight change.  HENT: Negative for congestion, rhinorrhea, sinus pressure, sneezing, sore throat and trouble swallowing.   Eyes: Negative for photophobia and visual disturbance.  Respiratory: Negative for cough, chest tightness, shortness of breath, wheezing and stridor.   Cardiovascular: Negative for chest pain, palpitations and leg swelling.  Gastrointestinal: Negative for nausea, vomiting, abdominal pain, diarrhea, constipation, blood in stool, abdominal distention and anal bleeding.  Genitourinary: Negative for dysuria, hematuria, flank pain and difficulty urinating.  Musculoskeletal: Negative for arthralgias, back pain, joint swelling and myalgias.  Skin: Negative for color change, pallor, rash and wound.  Neurological: Positive for numbness. Negative for dizziness, tremors, weakness and light-headedness.  Hematological: Negative for adenopathy. Does not bruise/bleed easily.  Psychiatric/Behavioral: Negative for behavioral problems, confusion, sleep disturbance, dysphoric mood and agitation.       Objective:   Physical Exam  Constitutional: He is oriented to person, place, and time. He appears well-developed and well-nourished. No distress.  HENT:  Head: Normocephalic and atraumatic.  Mouth/Throat: Oropharynx is clear and moist. No oropharyngeal exudate, posterior oropharyngeal edema, posterior oropharyngeal erythema or tonsillar abscesses.  Eyes: Conjunctivae and EOM are normal. Pupils are  equal, round, and reactive to light.  Neck: Normal range of motion. Neck supple.  Cardiovascular: Normal rate and regular rhythm.   Pulmonary/Chest: Effort normal. No respiratory distress.  Abdominal: He exhibits no distension.  Musculoskeletal: He exhibits no edema and no tenderness.  Neurological: He is alert and oriented to person, place, and time. He has normal reflexes. Coordination normal.  Skin: Skin is warm and dry. He is not diaphoretic. No erythema. No pallor.  Psychiatric: He has a normal mood and affect. His behavior is normal. Judgment and thought content normal.          Assessment & Plan:  HIV: Change to TIVICAY and Truvada, check viral load and CD4 count in one month's time. I spent greater than 25 minutes with the patient including greater than 50% of time in face to face counsel of the patient and in coordination of their care.   Painful neuropathy: continue gabapentin and oxycodone.  Her tox screen was positive for opiates by confirmatory assay   CAD at goal with LDL and  Is actually been off of Lipitor due to a misunderstanding that we had. Therefore I placed him on low-dose 20 mg Lipitor to get the anti-inflammatory effect. Again I think metoprolol would be a better drug than aspirin lactam.   Obesity: tremendous job losing weight and continues to do so  Smoking: she quit smoking and that is a highly commendable thing that has acted to reduce risk here x 2 years out

## 2014-03-11 ENCOUNTER — Other Ambulatory Visit: Payer: Self-pay | Admitting: *Deleted

## 2014-03-11 ENCOUNTER — Ambulatory Visit: Payer: Self-pay | Admitting: Infectious Disease

## 2014-03-11 DIAGNOSIS — B20 Human immunodeficiency virus [HIV] disease: Secondary | ICD-10-CM

## 2014-03-11 MED ORDER — EMTRICITABINE-TENOFOVIR DF 200-300 MG PO TABS
1.0000 | ORAL_TABLET | Freq: Every day | ORAL | Status: DC
Start: 2014-03-11 — End: 2014-03-22

## 2014-03-22 ENCOUNTER — Other Ambulatory Visit: Payer: Self-pay | Admitting: *Deleted

## 2014-03-22 DIAGNOSIS — B20 Human immunodeficiency virus [HIV] disease: Secondary | ICD-10-CM

## 2014-03-22 MED ORDER — DOLUTEGRAVIR SODIUM 50 MG PO TABS: 50.0000 mg | ORAL_TABLET | Freq: Every day | ORAL | Status: DC

## 2014-03-22 MED ORDER — EMTRICITABINE-TENOFOVIR DF 200-300 MG PO TABS: 1.0000 | ORAL_TABLET | Freq: Every day | ORAL | Status: DC

## 2014-04-09 ENCOUNTER — Telehealth: Payer: Self-pay | Admitting: *Deleted

## 2014-04-09 NOTE — Telephone Encounter (Signed)
Patient called for a refill on his oxy but it was early so advised him it will be ready to pick up next week.

## 2014-04-12 ENCOUNTER — Other Ambulatory Visit: Payer: Self-pay | Admitting: *Deleted

## 2014-04-12 DIAGNOSIS — R52 Pain, unspecified: Secondary | ICD-10-CM

## 2014-04-12 MED ORDER — OXYCODONE-ACETAMINOPHEN 10-325 MG PO TABS
1.0000 | ORAL_TABLET | Freq: Four times a day (QID) | ORAL | Status: DC | PRN
Start: 2014-04-12 — End: 2014-04-20

## 2014-04-13 ENCOUNTER — Other Ambulatory Visit: Payer: Self-pay | Admitting: Infectious Disease

## 2014-04-13 ENCOUNTER — Other Ambulatory Visit: Payer: 59

## 2014-04-13 DIAGNOSIS — B20 Human immunodeficiency virus [HIV] disease: Secondary | ICD-10-CM

## 2014-04-13 DIAGNOSIS — Z79891 Long term (current) use of opiate analgesic: Secondary | ICD-10-CM

## 2014-04-13 LAB — COMPLETE METABOLIC PANEL WITH GFR
ALT: 33 U/L (ref 0–53)
AST: 22 U/L (ref 0–37)
Albumin: 4 g/dL (ref 3.5–5.2)
Alkaline Phosphatase: 88 U/L (ref 39–117)
BILIRUBIN TOTAL: 0.4 mg/dL (ref 0.2–1.2)
BUN: 15 mg/dL (ref 6–23)
CO2: 23 meq/L (ref 19–32)
Calcium: 9.3 mg/dL (ref 8.4–10.5)
Chloride: 106 mEq/L (ref 96–112)
Creat: 1.02 mg/dL (ref 0.50–1.35)
GFR, Est Non African American: >89 mL/min
Glucose, Bld: 89 mg/dL (ref 70–99)
Potassium: 4.4 mEq/L (ref 3.5–5.3)
Sodium: 138 mEq/L (ref 135–145)
TOTAL PROTEIN: 7.1 g/dL (ref 6.0–8.3)

## 2014-04-13 LAB — CBC WITH DIFFERENTIAL/PLATELET
BASOS ABS: 0 10*3/uL (ref 0.0–0.1)
Basophils Relative: 0 % (ref 0–1)
EOS ABS: 0.1 10*3/uL (ref 0.0–0.7)
Eosinophils Relative: 1 % (ref 0–5)
HCT: 43.1 % (ref 39.0–52.0)
Hemoglobin: 15.3 g/dL (ref 13.0–17.0)
LYMPHS ABS: 4.5 10*3/uL — AB (ref 0.7–4.0)
Lymphocytes Relative: 45 % (ref 12–46)
MCH: 30.4 pg (ref 26.0–34.0)
MCHC: 35.5 g/dL (ref 30.0–36.0)
MCV: 85.5 fL (ref 78.0–100.0)
Monocytes Absolute: 0.6 10*3/uL (ref 0.1–1.0)
Monocytes Relative: 6 % (ref 3–12)
NEUTROS PCT: 48 % (ref 43–77)
Neutro Abs: 4.8 10*3/uL (ref 1.7–7.7)
Platelets: 256 10*3/uL (ref 150–400)
RBC: 5.04 MIL/uL (ref 4.22–5.81)
RDW: 14.8 % (ref 11.5–15.5)
WBC: 10.1 10*3/uL (ref 4.0–10.5)

## 2014-04-13 NOTE — Addendum Note (Signed)
Addended by: Mariea ClontsGREEN, Bostyn Bogie D on: 04/13/2014 02:08 PM   Modules accepted: Orders

## 2014-04-14 LAB — DRUG SCREEN, URINE
Amphetamine Screen, Ur: NEGATIVE
BARBITURATE QUANT UR: NEGATIVE
Benzodiazepines.: NEGATIVE
Cocaine Metabolites: NEGATIVE
Creatinine,U: 151.44 mg/dL
Marijuana Metabolite: NEGATIVE
Methadone: NEGATIVE
Opiates: POSITIVE — AB
PHENCYCLIDINE (PCP): NEGATIVE
PROPOXYPHENE: NEGATIVE

## 2014-04-14 LAB — HIV-1 RNA QUANT-NO REFLEX-BLD
HIV 1 RNA QUANT: 65 {copies}/mL — AB (ref <20)
HIV-1 RNA Quant, Log: 1.81 {Log} — ABNORMAL HIGH (ref <1.30)

## 2014-04-14 LAB — T-HELPER CELL (CD4) - (RCID CLINIC ONLY)
CD4 T CELL HELPER: 30 % — AB (ref 33–55)
CD4 T Cell Abs: 1630 /uL (ref 400–2700)

## 2014-04-16 LAB — OPIATES/OPIOIDS (LC/MS-MS)
CODEINE URINE: NEGATIVE ng/mL
Heroin (6-AM), UR: NEGATIVE ng/mL
Hydrocodone: NEGATIVE ng/mL
Hydromorphone: NEGATIVE ng/mL
Morphine Urine: NEGATIVE ng/mL
NORHYDROCODONE, UR: NEGATIVE ng/mL
Noroxycodone, Ur: 4115 ng/mL
OXYCODONE, UR: 1708 ng/mL
Oxymorphone: 225 ng/mL

## 2014-04-19 ENCOUNTER — Other Ambulatory Visit: Payer: Self-pay | Admitting: Infectious Disease

## 2014-04-19 ENCOUNTER — Telehealth: Payer: Self-pay | Admitting: *Deleted

## 2014-04-19 DIAGNOSIS — G63 Polyneuropathy in diseases classified elsewhere: Principal | ICD-10-CM

## 2014-04-19 DIAGNOSIS — B20 Human immunodeficiency virus [HIV] disease: Secondary | ICD-10-CM
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
## 2014-04-19 NOTE — Telephone Encounter (Signed)
The report is in your box here.

## 2014-04-19 NOTE — Telephone Encounter (Signed)
Patient called requesting the signed prescription be ready for pick up today.  RN again advised patient that Dr. Daiva EvesVan Dam was not in clinic today, and that only Dr. Daiva EvesVan Dam could authorize the refill.  Patient became belligerent, demanding to know in which hospital Dr. Daiva EvesVan Dam was working and wanted to know "why he wouldn't walk across the street just to sign my prescription."  RN advised patient to calm down, stating that he was yelling at this nurse.  Patient continued to yell that he just didn't understand what the hold up was, why he couldn't get the prescription.  When RN did not reply to the outburst, patient stated "that bitch didn't just hang up on me."  This nurse stated I was still there, but that I could not help him at this time, repeating that Dr. Daiva EvesVan Dam was the only one to make this decision.  RN said good bye to the patient and hung up.

## 2014-04-19 NOTE — Telephone Encounter (Signed)
No percocet AT all until we have the police report etc etc and this is a trigger for me to get him off these drugs at least the percocet, but I will tread lightly. No police report no percocet

## 2014-04-20 ENCOUNTER — Other Ambulatory Visit: Payer: Self-pay | Admitting: *Deleted

## 2014-04-20 DIAGNOSIS — R52 Pain, unspecified: Secondary | ICD-10-CM

## 2014-04-20 MED ORDER — OXYCODONE-ACETAMINOPHEN 10-325 MG PO TABS: 1.0000 | ORAL_TABLET | Freq: Four times a day (QID) | ORAL | Status: DC | PRN

## 2014-04-20 NOTE — Telephone Encounter (Signed)
As per my discussion with Tammy he can have refill for now  I would like to examine the police report  BUT he needs appt with me BEFORE ANY further refills  AND we need to make plans to move his narcotic rx to a different provider  Our suspicion for diversion is now high  I also understand he verbally made a threat to "take care of you" which sounded like a threat to cause harm or kill you.  THAT Is unacceptable behavior regardless of the interaction  [facility] am [facility] he needs to understand that type of behavior and threat WILL NOT be tolerated in our clinic PERIOD

## 2014-04-21 ENCOUNTER — Other Ambulatory Visit: Payer: Self-pay | Admitting: Infectious Disease

## 2014-04-21 ENCOUNTER — Other Ambulatory Visit: Payer: 59

## 2014-04-21 DIAGNOSIS — Z79891 Long term (current) use of opiate analgesic: Secondary | ICD-10-CM

## 2014-04-22 LAB — DRUG SCREEN, URINE
Amphetamine Screen, Ur: NEGATIVE
Barbiturate Quant, Ur: NEGATIVE
Benzodiazepines.: NEGATIVE
COCAINE METABOLITES: NEGATIVE
Creatinine,U: 48.82 mg/dL
MARIJUANA METABOLITE: NEGATIVE
Methadone: NEGATIVE
OPIATES: NEGATIVE
Phencyclidine (PCP): NEGATIVE
Propoxyphene: NEGATIVE

## 2014-04-23 LAB — OPIATES/OPIOIDS (LC/MS-MS)
Codeine Urine: NEGATIVE ng/mL
HYDROCODONE: NEGATIVE ng/mL
HYDROMORPHONE: NEGATIVE ng/mL
Heroin (6-AM), UR: NEGATIVE ng/mL
MORPHINE: NEGATIVE ng/mL
NORHYDROCODONE, UR: NEGATIVE ng/mL
Noroxycodone, Ur: 1473 ng/mL
OXYMORPHONE, URINE: 140 ng/mL
Oxycodone, ur: 428 ng/mL

## 2014-05-12 ENCOUNTER — Ambulatory Visit: Payer: 59 | Admitting: Infectious Disease

## 2014-05-14 ENCOUNTER — Other Ambulatory Visit: Payer: Self-pay | Admitting: Infectious Disease

## 2014-05-20 ENCOUNTER — Other Ambulatory Visit: Payer: Self-pay | Admitting: Licensed Clinical Social Worker

## 2014-05-20 DIAGNOSIS — R52 Pain, unspecified: Secondary | ICD-10-CM

## 2014-05-20 MED ORDER — OXYCODONE-ACETAMINOPHEN 10-325 MG PO TABS: 1.0000 | ORAL_TABLET | Freq: Four times a day (QID) | ORAL | Status: DC | PRN

## 2014-05-26 ENCOUNTER — Telehealth: Payer: Self-pay

## 2014-05-26 ENCOUNTER — Ambulatory Visit: Payer: 59 | Admitting: Infectious Disease

## 2014-06-02 ENCOUNTER — Telehealth: Payer: Self-pay | Admitting: *Deleted

## 2014-06-02 NOTE — Telephone Encounter (Signed)
We also have some spare kaletra and isentress which he could take if he comes close to running out of this reyataz and truvada.

## 2014-06-02 NOTE — Telephone Encounter (Signed)
Patient called stating he is no longer insured and needs to apply for ADAP. [name], ADAP coordinator has processed his application and is waiting for patient to come in to sign. He has an appt with Dr. Daiva Eves on 06/08/14, but will try to come in sooner so application can be sent out.  He ran out of the Tivicay and had some Reyataz and Norvir at home so started back on that with Truvada. Wendall Mola

## 2014-06-08 ENCOUNTER — Ambulatory Visit: Payer: 59 | Admitting: Infectious Disease

## 2014-06-15 ENCOUNTER — Other Ambulatory Visit: Payer: Self-pay | Admitting: *Deleted

## 2014-06-15 DIAGNOSIS — R52 Pain, unspecified: Secondary | ICD-10-CM

## 2014-06-15 MED ORDER — OXYCODONE-ACETAMINOPHEN 10-325 MG PO TABS: 1.0000 | ORAL_TABLET | Freq: Four times a day (QID) | ORAL | Status: DC | PRN

## 2014-06-23 ENCOUNTER — Other Ambulatory Visit: Payer: Self-pay | Admitting: Infectious Disease

## 2014-06-30 ENCOUNTER — Other Ambulatory Visit: Payer: Self-pay | Admitting: Infectious Disease

## 2014-07-06 ENCOUNTER — Ambulatory Visit: Payer: 59 | Admitting: Infectious Disease

## 2014-07-14 ENCOUNTER — Telehealth: Payer: Self-pay | Admitting: *Deleted

## 2014-07-14 DIAGNOSIS — R52 Pain, unspecified: Secondary | ICD-10-CM

## 2014-07-14 DIAGNOSIS — G894 Chronic pain syndrome: Secondary | ICD-10-CM

## 2014-07-14 MED ORDER — OXYCODONE-ACETAMINOPHEN 10-325 MG PO TABS: 1.0000 | ORAL_TABLET | Freq: Four times a day (QID) | ORAL | Status: DC | PRN

## 2014-07-14 NOTE — Telephone Encounter (Signed)
Requested Dr. Drue SecondSnider to sign rx due to Dr. Daiva EvesVan Dam not being available.

## 2014-07-27 ENCOUNTER — Other Ambulatory Visit: Payer: Self-pay | Admitting: Infectious Disease

## 2014-07-29 ENCOUNTER — Encounter: Payer: Self-pay | Admitting: Infectious Disease

## 2014-07-29 ENCOUNTER — Other Ambulatory Visit: Payer: Self-pay | Admitting: Infectious Disease

## 2014-07-29 ENCOUNTER — Ambulatory Visit (INDEPENDENT_AMBULATORY_CARE_PROVIDER_SITE_OTHER): Payer: Self-pay | Admitting: Infectious Disease

## 2014-07-29 VITALS — BP 108/70 | HR 67 | Temp 98.0°F | Wt 200.0 lb

## 2014-07-29 DIAGNOSIS — I2510 Atherosclerotic heart disease of native coronary artery without angina pectoris: Secondary | ICD-10-CM

## 2014-07-29 DIAGNOSIS — G894 Chronic pain syndrome: Secondary | ICD-10-CM

## 2014-07-29 DIAGNOSIS — F640 Transsexualism: Secondary | ICD-10-CM

## 2014-07-29 DIAGNOSIS — Z789 Other specified health status: Secondary | ICD-10-CM

## 2014-07-29 DIAGNOSIS — B20 Human immunodeficiency virus [HIV] disease: Secondary | ICD-10-CM

## 2014-07-29 DIAGNOSIS — G63 Polyneuropathy in diseases classified elsewhere: Secondary | ICD-10-CM

## 2014-07-29 DIAGNOSIS — G909 Disorder of the autonomic nervous system, unspecified: Secondary | ICD-10-CM
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
## 2014-07-30 ENCOUNTER — Telehealth: Payer: Self-pay | Admitting: *Deleted

## 2014-07-30 LAB — DRUG SCREEN, URINE
AMPHETAMINE SCRN UR: NEGATIVE
BARBITURATE QUANT UR: POSITIVE — AB
Benzodiazepines.: NEGATIVE
COCAINE METABOLITES: NEGATIVE
CREATININE, U: 201.66 mg/dL
Marijuana Metabolite: POSITIVE — AB
Methadone: NEGATIVE
OPIATES: POSITIVE — AB
PHENCYCLIDINE (PCP): NEGATIVE
Propoxyphene: NEGATIVE

## 2014-07-30 NOTE — Telephone Encounter (Signed)
Message copied by Macy MisOCKERHAM, JACQUELINE A on Fri Jul 30, 2014  2:20 PM ------      Message from: VAN DAM, CORNELIUS N      Created: Fri Jul 30, 2014  9:49 AM       Douglas Salazar's tox screen which he volunteered to do yesterday came back positive for barbiturates. THIS IS A VIOLATION OF his pain contract. Therefore he will be phased out of prescription narcotics from us. He has picked up script from Dr. Drue SecondSnider this month and he may have one more next month but then I am stopping his narcotics ------

## 2014-07-30 NOTE — Telephone Encounter (Addendum)
See below note from Dr. Daiva EvesVan Dam. Scheduled patient appointment with Dr. Daiva EvesVan Dam for 08/16/14. Wendall MolaJacqueline Cockerham

## 2014-07-31 LAB — OPIATES/OPIOIDS (LC/MS-MS)
CODEINE URINE: NEGATIVE ng/mL (ref <50)
HYDROMORPHONE: NEGATIVE ng/mL (ref <50)
Hydrocodone: NEGATIVE ng/mL (ref <50)
MORPHINE: NEGATIVE ng/mL (ref <50)
NORHYDROCODONE, UR: NEGATIVE ng/mL (ref <50)
Noroxycodone, Ur: 4420 ng/mL — ABNORMAL HIGH (ref <50)
OXYCODONE, UR: 708 ng/mL — AB (ref <50)
OXYMORPHONE, URINE: 556 ng/mL — AB (ref <50)

## 2014-08-02 ENCOUNTER — Other Ambulatory Visit: Payer: Self-pay | Admitting: *Deleted

## 2014-08-02 DIAGNOSIS — B20 Human immunodeficiency virus [HIV] disease: Secondary | ICD-10-CM

## 2014-08-02 MED ORDER — DOLUTEGRAVIR SODIUM 50 MG PO TABS: 50.0000 mg | ORAL_TABLET | Freq: Every day | ORAL | Status: DC

## 2014-08-02 MED ORDER — EMTRICITABINE-TENOFOVIR DF 200-300 MG PO TABS: 1.0000 | ORAL_TABLET | Freq: Every day | ORAL | Status: DC

## 2014-08-02 NOTE — Telephone Encounter (Signed)
ADAP Application 

## 2014-08-06 NOTE — Telephone Encounter (Signed)
Patient scheduled to see [name] 08/24/14 at 11 am as per conversation with Dr Daiva EvesVan Dam at last visit.

## 2014-08-16 ENCOUNTER — Encounter: Payer: Self-pay | Admitting: Infectious Disease

## 2014-08-16 ENCOUNTER — Other Ambulatory Visit: Payer: Self-pay | Admitting: Infectious Disease

## 2014-08-16 ENCOUNTER — Ambulatory Visit (INDEPENDENT_AMBULATORY_CARE_PROVIDER_SITE_OTHER): Payer: Self-pay | Admitting: Infectious Disease

## 2014-08-16 VITALS — BP 100/68 | HR 55 | Temp 97.6°F | Wt 205.0 lb

## 2014-08-16 DIAGNOSIS — G63 Polyneuropathy in diseases classified elsewhere: Secondary | ICD-10-CM

## 2014-08-16 DIAGNOSIS — Z87898 Personal history of other specified conditions: Secondary | ICD-10-CM

## 2014-08-16 DIAGNOSIS — F17200 Nicotine dependence, unspecified, uncomplicated: Secondary | ICD-10-CM

## 2014-08-16 DIAGNOSIS — G609 Hereditary and idiopathic neuropathy, unspecified: Secondary | ICD-10-CM

## 2014-08-16 DIAGNOSIS — F1311 Sedative, hypnotic or anxiolytic abuse, in remission: Secondary | ICD-10-CM

## 2014-08-16 DIAGNOSIS — F1110 Opioid abuse, uncomplicated: Secondary | ICD-10-CM

## 2014-08-16 DIAGNOSIS — G909 Disorder of the autonomic nervous system, unspecified: Principal | ICD-10-CM

## 2014-08-16 DIAGNOSIS — F1310 Sedative, hypnotic or anxiolytic abuse, uncomplicated: Secondary | ICD-10-CM

## 2014-08-16 DIAGNOSIS — F1191 Opioid use, unspecified, in remission: Secondary | ICD-10-CM

## 2014-08-16 DIAGNOSIS — I2510 Atherosclerotic heart disease of native coronary artery without angina pectoris: Secondary | ICD-10-CM

## 2014-08-16 DIAGNOSIS — B20 Human immunodeficiency virus [HIV] disease: Secondary | ICD-10-CM

## 2014-08-16 MED ORDER — GABAPENTIN 400 MG PO CAPS: 400.0000 mg | ORAL_CAPSULE | Freq: Three times a day (TID) | ORAL | Status: DC

## 2014-08-16 MED ORDER — OXYCODONE-ACETAMINOPHEN 10-325 MG PO TABS: 1.0000 | ORAL_TABLET | Freq: Three times a day (TID) | ORAL | Status: AC | PRN

## 2014-08-16 MED ORDER — OXYCODONE-ACETAMINOPHEN 5-325 MG PO TABS: 1.0000 | ORAL_TABLET | Freq: Three times a day (TID) | ORAL | Status: DC | PRN

## 2014-08-16 MED ORDER — GABAPENTIN 800 MG PO TABS: 800.0000 mg | ORAL_TABLET | Freq: Three times a day (TID) | ORAL | Status: DC

## 2014-08-16 NOTE — Patient Instructions (Signed)
We are going to increase your gabapentin to 1200mg  three times daily  We are dropping the oxycodone to #90 tablets and I will plan on reducing this stepwise until you are off of it altogether  Thanks for coming in, taking such good care of yourself and thanks for meeting with Jodie next week
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
## 2014-08-17 LAB — DRUG SCREEN, URINE
Amphetamine Screen, Ur: NEGATIVE
BARBITURATE QUANT UR: NEGATIVE
Benzodiazepines.: NEGATIVE
Cocaine Metabolites: NEGATIVE
Creatinine,U: 122.52 mg/dL
Marijuana Metabolite: POSITIVE — AB
Methadone: NEGATIVE
Opiates: NEGATIVE
Phencyclidine (PCP): NEGATIVE
Propoxyphene: NEGATIVE

## 2014-08-19 ENCOUNTER — Other Ambulatory Visit: Payer: Self-pay | Admitting: Licensed Clinical Social Worker

## 2014-08-19 LAB — OPIATES/OPIOIDS (LC/MS-MS)
CODEINE URINE: NEGATIVE ng/mL (ref <50)
Hydrocodone: NEGATIVE ng/mL (ref <50)
Hydromorphone: NEGATIVE ng/mL (ref <50)
MORPHINE: NEGATIVE ng/mL (ref <50)
Norhydrocodone, Ur: NEGATIVE ng/mL (ref <50)
Noroxycodone, Ur: 1439 ng/mL — ABNORMAL HIGH (ref <50)
OXYMORPHONE, URINE: 281 ng/mL — AB (ref <50)
Oxycodone, ur: 1829 ng/mL — ABNORMAL HIGH (ref <50)

## 2014-08-23 ENCOUNTER — Ambulatory Visit: Payer: Self-pay | Admitting: Infectious Disease

## 2014-09-10 ENCOUNTER — Telehealth: Payer: Self-pay | Admitting: *Deleted

## 2014-09-10 NOTE — Telephone Encounter (Signed)
Patient called requesting his Percocet refill to pick up 09/13/14; it is due on 9/17 but he is going out of town. He also is requesting to go back to the quantity of 115. Explained that Dr. Daiva Eves is tapering him down each month and he states he does not understand this and even a quantity of 90 is not sufficient. He said he saw Jodie the substance abuse counselor on 08/24/14, but unfortunately at that time she was not documenting in EPIC. He said she did not think it was necessary for him to see her and there is no follow up scheduled. He believes that a misunderstanding with a nurse here is the cause of his pain contract being revoked. He also asked about the pain clinic referral; however he is uninsured and the discount card does not cover any pain clinics. He said  he thinks Tamika, CMA is also working on a podiatry referral for him. Advised him to call the clinic on Monday regarding this refill. If the new quantity is #60 Dr. Daiva Eves will need to call patient directly to inform him.

## 2014-09-13 ENCOUNTER — Other Ambulatory Visit: Payer: Self-pay | Admitting: *Deleted

## 2014-09-13 ENCOUNTER — Telehealth: Payer: Self-pay | Admitting: *Deleted

## 2014-09-13 MED ORDER — OXYCODONE-ACETAMINOPHEN 10-325 MG PO TABS: 1.0000 | ORAL_TABLET | Freq: Two times a day (BID) | ORAL | Status: DC

## 2014-09-13 NOTE — Telephone Encounter (Signed)
Patient came to pick up his percocet Rx and stated he would find his own pain clinic and once he does that will sign a release to send his records. Douglas Salazar

## 2014-09-13 NOTE — Telephone Encounter (Signed)
Pt picked up the #60 today. I told him at prior appt that I would have to wean him off meds and/or he would have to find another provider who was willing to prescribe them.

## 2014-09-22 ENCOUNTER — Other Ambulatory Visit: Payer: Self-pay | Admitting: Licensed Clinical Social Worker

## 2014-09-22 DIAGNOSIS — M7730 Calcaneal spur, unspecified foot: Secondary | ICD-10-CM

## 2014-10-11 ENCOUNTER — Other Ambulatory Visit: Payer: Self-pay | Admitting: Infectious Disease

## 2014-10-13 ENCOUNTER — Other Ambulatory Visit: Payer: Self-pay | Admitting: Licensed Clinical Social Worker

## 2014-10-13 MED ORDER — OXYCODONE-ACETAMINOPHEN 10-325 MG PO TABS: 1.0000 | ORAL_TABLET | Freq: Two times a day (BID) | ORAL | Status: DC

## 2014-10-20 ENCOUNTER — Ambulatory Visit: Payer: Self-pay | Admitting: Podiatry

## 2014-10-20 ENCOUNTER — Ambulatory Visit: Payer: Self-pay | Admitting: Podiatrist

## 2014-10-26 ENCOUNTER — Other Ambulatory Visit: Payer: Self-pay | Admitting: *Deleted

## 2014-10-26 DIAGNOSIS — B20 Human immunodeficiency virus [HIV] disease: Secondary | ICD-10-CM

## 2014-10-26 MED ORDER — EMTRICITABINE-TENOFOVIR DF 200-300 MG PO TABS: 1.0000 | ORAL_TABLET | Freq: Every day | ORAL | Status: DC

## 2014-10-26 MED ORDER — DOLUTEGRAVIR SODIUM 50 MG PO TABS: 50.0000 mg | ORAL_TABLET | Freq: Every day | ORAL | Status: DC

## 2014-11-16 ENCOUNTER — Other Ambulatory Visit: Payer: Self-pay

## 2014-11-22 ENCOUNTER — Other Ambulatory Visit (INDEPENDENT_AMBULATORY_CARE_PROVIDER_SITE_OTHER): Payer: Self-pay

## 2014-11-22 ENCOUNTER — Ambulatory Visit (INDEPENDENT_AMBULATORY_CARE_PROVIDER_SITE_OTHER): Payer: Self-pay | Admitting: Licensed Clinical Social Worker

## 2014-11-22 ENCOUNTER — Ambulatory Visit: Payer: Self-pay | Admitting: Licensed Clinical Social Worker

## 2014-11-22 DIAGNOSIS — B20 Human immunodeficiency virus [HIV] disease: Secondary | ICD-10-CM

## 2014-11-22 DIAGNOSIS — Z23 Encounter for immunization: Secondary | ICD-10-CM

## 2014-11-22 DIAGNOSIS — Z113 Encounter for screening for infections with a predominantly sexual mode of transmission: Secondary | ICD-10-CM

## 2014-11-22 DIAGNOSIS — Z79899 Other long term (current) drug therapy: Secondary | ICD-10-CM

## 2014-11-22 LAB — COMPLETE METABOLIC PANEL WITH GFR
ALBUMIN: 4.4 g/dL (ref 3.5–5.2)
ALT: 29 U/L (ref 0–53)
AST: 23 U/L (ref 0–37)
Alkaline Phosphatase: 88 U/L (ref 39–117)
BUN: 11 mg/dL (ref 6–23)
CO2: 27 mEq/L (ref 19–32)
Calcium: 9.8 mg/dL (ref 8.4–10.5)
Chloride: 104 mEq/L (ref 96–112)
Creat: 0.93 mg/dL (ref 0.50–1.35)
GFR, Est African American: >89 mL/min
GLUCOSE: 90 mg/dL (ref 70–99)
Potassium: 4.6 mEq/L (ref 3.5–5.3)
SODIUM: 139 meq/L (ref 135–145)
Total Bilirubin: 0.5 mg/dL (ref 0.2–1.2)
Total Protein: 7.3 g/dL (ref 6.0–8.3)

## 2014-11-22 LAB — CBC WITH DIFFERENTIAL/PLATELET
BASOS PCT: 0 % (ref 0–1)
Basophils Absolute: 0 10*3/uL (ref 0.0–0.1)
Eosinophils Absolute: 0 10*3/uL (ref 0.0–0.7)
Eosinophils Relative: 0 % (ref 0–5)
HCT: 44.1 % (ref 39.0–52.0)
HEMOGLOBIN: 15.6 g/dL (ref 13.0–17.0)
LYMPHS ABS: 5.3 10*3/uL — AB (ref 0.7–4.0)
Lymphocytes Relative: 47 % — ABNORMAL HIGH (ref 12–46)
MCH: 30.2 pg (ref 26.0–34.0)
MCHC: 35.4 g/dL (ref 30.0–36.0)
MCV: 85.5 fL (ref 78.0–100.0)
MONOS PCT: 5 % (ref 3–12)
MPV: 10.7 fL (ref 9.4–12.4)
Monocytes Absolute: 0.6 10*3/uL (ref 0.1–1.0)
NEUTROS ABS: 5.4 10*3/uL (ref 1.7–7.7)
Neutrophils Relative %: 48 % (ref 43–77)
Platelets: 224 10*3/uL (ref 150–400)
RBC: 5.16 MIL/uL (ref 4.22–5.81)
RDW: 13.9 % (ref 11.5–15.5)
WBC: 11.3 10*3/uL — AB (ref 4.0–10.5)

## 2014-11-22 LAB — LIPID PANEL
Cholesterol: 146 mg/dL (ref 0–200)
HDL: 31 mg/dL — AB (ref >39)
LDL Cholesterol: 77 mg/dL (ref 0–99)
Total CHOL/HDL Ratio: 4.7 Ratio
Triglycerides: 190 mg/dL — ABNORMAL HIGH (ref <150)
VLDL: 38 mg/dL (ref 0–40)

## 2014-11-23 LAB — T-HELPER CELL (CD4) - (RCID CLINIC ONLY)
CD4 % Helper T Cell: 37 % (ref 33–55)
CD4 T Cell Abs: 2090 /uL (ref 400–2700)

## 2014-11-23 LAB — RPR

## 2014-11-23 LAB — HIV-1 RNA QUANT-NO REFLEX-BLD: HIV 1 RNA Quant: <20 copies/mL (ref <20)

## 2014-11-24 ENCOUNTER — Telehealth: Payer: Self-pay | Admitting: *Deleted

## 2014-11-24 NOTE — Telephone Encounter (Signed)
Received fax from [facility] stating they are unable to refer patient to podiatry because as of now it is limited to ingrown toenail only. Douglas MolaJacqueline Salazar

## 2014-11-30 ENCOUNTER — Ambulatory Visit: Payer: Self-pay | Admitting: Infectious Disease

## 2014-12-02 NOTE — Telephone Encounter (Signed)
Patient notified

## 2014-12-10 ENCOUNTER — Telehealth: Payer: Self-pay | Admitting: *Deleted

## 2014-12-10 NOTE — Telephone Encounter (Signed)
Patient called asking for a referral to [facility] [place], [place] [phone]. I called to find out the cost for a self pay patient and the initial charge is $400 and follow up visits $175. I notified patient of this and he said to never mind he would continue to look and let us know if he can find somewhere less expensive. Wendall MolaJacqueline Cockerham CMA

## 2015-01-14 ENCOUNTER — Other Ambulatory Visit: Payer: Self-pay | Admitting: Licensed Clinical Social Worker

## 2015-01-14 ENCOUNTER — Other Ambulatory Visit: Payer: Self-pay | Admitting: Infectious Disease

## 2015-01-14 MED ORDER — SPIRONOLACTONE 25 MG PO TABS: 25.0000 mg | ORAL_TABLET | Freq: Every day | ORAL | Status: DC

## 2015-02-14 ENCOUNTER — Ambulatory Visit: Payer: Self-pay

## 2015-02-16 ENCOUNTER — Other Ambulatory Visit: Payer: Self-pay | Admitting: Internal Medicine

## 2015-02-16 DIAGNOSIS — Z0000 Encounter for general adult medical examination without abnormal findings: Secondary | ICD-10-CM

## 2015-02-18 ENCOUNTER — Telehealth: Payer: Self-pay | Admitting: Internal Medicine

## 2015-02-18 NOTE — Telephone Encounter (Signed)
Pt called checking on his pain clinic referral [facility] [place]

## 2015-02-18 NOTE — Telephone Encounter (Signed)
Called the patient back to let him know that he spoke to a different office about the a Pain Clinic Referral not our office, and that we can iniatiate his referral after seeing him for a visit to discuss this referral as medical records will need to be faxed to the Pain Clinic in order for them to accept him. Also told him that we need to iniatiate all of his referrals  b/c we are listed as his PCP. Called the Pain clinic and they faxed over their referral form. We will fill out referral [facility].

## 2015-02-21 ENCOUNTER — Other Ambulatory Visit (INDEPENDENT_AMBULATORY_CARE_PROVIDER_SITE_OTHER): Payer: 59

## 2015-02-21 ENCOUNTER — Ambulatory Visit: Payer: Self-pay

## 2015-02-21 ENCOUNTER — Other Ambulatory Visit: Payer: Self-pay

## 2015-02-21 DIAGNOSIS — G629 Polyneuropathy, unspecified: Secondary | ICD-10-CM

## 2015-02-21 DIAGNOSIS — G894 Chronic pain syndrome: Secondary | ICD-10-CM

## 2015-02-21 DIAGNOSIS — Z0000 Encounter for general adult medical examination without abnormal findings: Secondary | ICD-10-CM

## 2015-02-21 LAB — CBC
HEMATOCRIT: 45.6 % (ref 39.0–52.0)
HEMOGLOBIN: 15.4 g/dL (ref 13.0–17.0)
MCHC: 33.8 g/dL (ref 30.0–36.0)
MCV: 90.6 fl (ref 78.0–100.0)
PLATELETS: 224 10*3/uL (ref 150.0–400.0)
RBC: 5.03 Mil/uL (ref 4.22–5.81)
RDW: 14 % (ref 11.5–15.5)
WBC: 10.9 10*3/uL — ABNORMAL HIGH (ref 4.0–10.5)

## 2015-02-21 NOTE — Telephone Encounter (Signed)
Pt states the referral was placed by ID provider and insurance needs from PCP--pt states he wants to go to [facility] [place] and wants to know if you can just give Rx to last until pain mgmt appt--pt states if you are not willing to prescribe controlled substances--he needs to change providers--please advise

## 2015-02-21 NOTE — Telephone Encounter (Signed)
I do not fill chronic pain meds. He has been referred to pain management

## 2015-02-21 NOTE — Telephone Encounter (Signed)
Pt left note requesting rx oxycodone apap. Call when ready for pick up . Pt noted hands and feet neuropathy, heel spurs and nerve damage. Pt has appt with Nicki Reaperegina Baity NP 02/28/15 for CPX. Pt last seen 02/26/14. Oxycodone apap last printed rx 10/13/14.Please advise.

## 2015-02-22 LAB — COMPREHENSIVE METABOLIC PANEL
ALT: 17 U/L (ref 0–53)
AST: 18 U/L (ref 0–37)
Albumin: 4.3 g/dL (ref 3.5–5.2)
Alkaline Phosphatase: 86 U/L (ref 39–117)
BILIRUBIN TOTAL: 0.4 mg/dL (ref 0.2–1.2)
BUN: 12 mg/dL (ref 6–23)
CHLORIDE: 107 meq/L (ref 96–112)
CO2: 23 meq/L (ref 19–32)
Calcium: 9.6 mg/dL (ref 8.4–10.5)
Creatinine, Ser: 1.07 mg/dL (ref 0.40–1.50)
GFR: 96.18 mL/min (ref >60.00)
GLUCOSE: 101 mg/dL — AB (ref 70–99)
Potassium: 4.5 mEq/L (ref 3.5–5.1)
Sodium: 137 mEq/L (ref 135–145)
Total Protein: 7.4 g/dL (ref 6.0–8.3)

## 2015-02-22 LAB — LIPID PANEL
CHOLESTEROL: 122 mg/dL (ref 0–200)
HDL: 33.3 mg/dL — ABNORMAL LOW (ref >39.00)
LDL CALC: 61 mg/dL (ref 0–99)
NonHDL: 88.7
Total CHOL/HDL Ratio: 4
Triglycerides: 139 mg/dL (ref 0.0–149.0)
VLDL: 27.8 mg/dL (ref 0.0–40.0)

## 2015-02-22 MED ORDER — OXYCODONE-ACETAMINOPHEN 10-325 MG PO TABS: 1.0000 | ORAL_TABLET | Freq: Two times a day (BID) | ORAL | Status: DC

## 2015-02-22 NOTE — Addendum Note (Signed)
Addended by: Lorre MunroeBAITY, [name] W on: 02/22/2015 08:12 AM   Modules accepted: Orders

## 2015-02-22 NOTE — Telephone Encounter (Signed)
Oxycodone given for 1 month supply Referral to pain clinic placed He needs to come in to complete a CSA and UDS

## 2015-02-22 NOTE — Telephone Encounter (Signed)
Pt is aware--Rx placed in front office for pick up--pt is aware he will need to sign a CSA--no mention of urine--he is aware

## 2015-02-23 ENCOUNTER — Other Ambulatory Visit: Payer: Self-pay | Admitting: *Deleted

## 2015-02-23 ENCOUNTER — Encounter: Payer: Self-pay | Admitting: Internal Medicine

## 2015-02-23 DIAGNOSIS — B20 Human immunodeficiency virus [HIV] disease: Secondary | ICD-10-CM

## 2015-02-23 MED ORDER — EMTRICITABINE-TENOFOVIR DF 200-300 MG PO TABS: 1.0000 | ORAL_TABLET | Freq: Every day | ORAL | Status: DC

## 2015-02-23 MED ORDER — DOLUTEGRAVIR SODIUM 50 MG PO TABS: 50.0000 mg | ORAL_TABLET | Freq: Every day | ORAL | Status: DC

## 2015-02-23 NOTE — Telephone Encounter (Signed)
ADAP Application 

## 2015-02-28 ENCOUNTER — Encounter: Payer: Self-pay | Admitting: Internal Medicine

## 2015-02-28 ENCOUNTER — Ambulatory Visit (INDEPENDENT_AMBULATORY_CARE_PROVIDER_SITE_OTHER): Payer: 59 | Admitting: Internal Medicine

## 2015-02-28 VITALS — BP 102/70 | HR 58 | Temp 97.7°F | Ht 69.5 in | Wt 177.0 lb

## 2015-02-28 DIAGNOSIS — B20 Human immunodeficiency virus [HIV] disease: Secondary | ICD-10-CM

## 2015-02-28 DIAGNOSIS — Z0000 Encounter for general adult medical examination without abnormal findings: Secondary | ICD-10-CM

## 2015-02-28 DIAGNOSIS — E785 Hyperlipidemia, unspecified: Secondary | ICD-10-CM

## 2015-02-28 DIAGNOSIS — G609 Hereditary and idiopathic neuropathy, unspecified: Secondary | ICD-10-CM

## 2015-02-28 DIAGNOSIS — I2510 Atherosclerotic heart disease of native coronary artery without angina pectoris: Secondary | ICD-10-CM

## 2015-02-28 NOTE — Progress Notes (Signed)
Pre visit review using our clinic review tool, if applicable. No additional management support is needed unless otherwise documented below in the visit note. 

## 2015-02-28 NOTE — Patient Instructions (Signed)

## 2015-02-28 NOTE — Assessment & Plan Note (Signed)
Continue statin for now

## 2015-02-28 NOTE — Assessment & Plan Note (Signed)
Continue antiviral therapy Continue to follow with ID

## 2015-02-28 NOTE — Assessment & Plan Note (Signed)
LDL at goal on Lipitor Continue low fat diet

## 2015-02-28 NOTE — Progress Notes (Signed)
Subjective:    Patient ID: Douglas Salazar, male    DOB: [birth year], 44 y.o.   MRN: [id number]  HPI  Pt presents to the clinic today for his annual exam. He is a male who identify's as a male. He is undergoing hormonal therapy but has not had any surgical intervention to aid his transition at this point.  Flu: 10/2014 Tetanus: < 10 years ago Pneumovax: 2011 Vision Screening: yearly Dentist: Biannually  He had labs 1 week prior. All labs normal.  CAD: LDL at goal on Lipitor  HIV: Follows with ID, who is following his CD 4 counts and viral loads.  Neuropathy: Debilitating. He is on neurontin and percocet. He is in between pain clinics and reports he is buying his medication on the "black market".   Review of Systems      Past Medical History  Diagnosis Date  . AIDS   . 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 CAD (coronary artery disease)   . Dysphagia   . Hyperlipidemia   . Myocardial infarction   . Neuromuscular disorder   . Substance abuse   . GERD (gastroesophageal reflux disease)   . Depression   . Hypertension   . Anxiety     Current Outpatient Prescriptions  Medication Sig Dispense Refill  . atorvastatin (LIPITOR) 20 MG tablet Take 1 tablet (20 mg total) by mouth daily. 30 tablet 11  . dolutegravir (TIVICAY) 50 MG tablet Take 1 tablet (50 mg total) by mouth daily. 30 tablet 5  . emtricitabine-tenofovir (TRUVADA) 200-300 MG per tablet Take 1 tablet by mouth daily. 30 tablet 5  . gabapentin (NEURONTIN) 400 MG capsule Take 1 capsule (400 mg total) by mouth 3 (three) times daily. 90 capsule 11  . gabapentin (NEURONTIN) 800 MG tablet Take 1 tablet (800 mg total) by mouth 3 (three) times daily. 90 tablet 11  . oxyCODONE-acetaminophen (PERCOCET) 10-325 MG per tablet Take 1 tablet by mouth 2 (two) times daily. 60 tablet 0  . spironolactone (ALDACTONE) 25 MG tablet Take 1 tablet (25 mg total) by mouth daily. 90 tablet 1   No current facility-administered  medications for this visit.    Allergies  Allergen Reactions  . Naproxen   . Sulfonamide Derivatives     Family History  Problem Relation Age of Onset  . Hypertension Mother   . Heart disease Father   . Hypertension Father   . Hearing loss Paternal Grandfather   . Cancer Neg Hx   . Diabetes Neg Hx   . Stroke Neg Hx     History   Social History  . Marital Status: Single    Spouse Name: N/A  . Number of Children: N/A  . Years of Education: N/A   Occupational History  . Not on file.   Social History Main Topics  . Smoking status: Former Smoker    Quit date: 05/31/2012  . Smokeless tobacco: Never Used     Comment: pt no longer smokes  . Alcohol Use: No  . Drug Use: No  . Sexual Activity: Not Currently     Comment: pt. declined condoms   Other Topics Concern  . Not on file   Social History Narrative     Constitutional: Denies fever, malaise, fatigue, headache or abrupt weight changes.  HEENT: Denies eye pain, eye redness, ear pain, ringing in the ears, wax buildup, runny nose, nasal congestion, bloody nose, or sore throat. Respiratory: Denies difficulty breathing, shortness of breath, cough or sputum production.  Cardiovascular: Denies chest pain, chest tightness, palpitations or swelling in the hands or feet.  Gastrointestinal: Denies abdominal pain, bloating, constipation, diarrhea or blood in the stool.  GU: Denies urgency, frequency, pain with urination, burning sensation, blood in urine, odor or discharge. Musculoskeletal: Denies decrease in range of motion, difficulty with gait, muscle pain or joint pain and swelling.  Skin: Denies redness, rashes, lesions or ulcercations.  Neurological: Pt reports nerve pain in feet. Denies dizziness, difficulty with memory, difficulty with speech or problems with balance and coordination.  Psych: Pt denies anxiety, depression, SI/HI.  No other specific complaints in a complete review of systems (except as listed in HPI  above).  Objective:   Physical Exam   BP 102/70 mmHg  Pulse 58  Temp(Src) 97.7 F (36.5 C) (Oral)  Ht 5' 9.5" (1.765 m)  Wt 177 lb (80.287 kg)  BMI 25.77 kg/m2  SpO2 98% Wt Readings from Last 3 Encounters:  02/28/15 177 lb (80.287 kg)  08/16/14 205 lb (92.987 kg)  07/29/14 200 lb (90.719 kg)    General: Appears his stated age, well developed, well nourished in NAD. Skin: Warm, dry and intact. No rashes, lesions or ulcerations noted. HEENT: Head: normal shape and size; Eyes: sclera white, no icterus, conjunctiva pink, PERRLA and EOMs intact; Ears: Tm's gray and intact, normal light reflex; Nose: mucosa pink and moist, septum midline; Throat/Mouth: Teeth present, mucosa pink and moist, no exudate, lesions or ulcerations noted.  Neck:  Neck supple, trachea midline. No masses, lumps or thyromegaly present.  Cardiovascular: Normal rate and rhythm. S1,S2 noted.  No murmur, rubs or gallops noted. No JVD or BLE edema. No carotid bruits noted. Pulmonary/Chest: Normal effort and positive vesicular breath sounds. No respiratory distress. No wheezes, rales or ronchi noted. Gynecomastia noted bilaterally. Abdomen: Soft and nontender. Normal bowel sounds, no bruits noted. No distention or masses noted. Liver, spleen and kidneys non palpable. Musculoskeletal: Normal range of motion. Strength 5/5 BUE/BLE. No difficulty with gait.  Neurological: Alert and oriented. Cranial nerves II-XII grossly intact. Coordination normal. Psychiatric: Mood and affect normal. Behavior is normal. Judgment and thought content normal.   EKG:  BMET    Component Value Date/Time   NA 137 02/21/2015 1222   K 4.5 02/21/2015 1222   CL 107 02/21/2015 1222   CO2 23 02/21/2015 1222   GLUCOSE 101* 02/21/2015 1222   GLUCOSE 77 12/08/2009   BUN 12 02/21/2015 1222   CREATININE 1.07 02/21/2015 1222   CREATININE 0.93 11/22/2014 1359   CALCIUM 9.6 02/21/2015 1222   GFRNONAA >89 11/22/2014 1359   GFRNONAA >60 08/13/2011  0540   GFRAA >89 11/22/2014 1359   GFRAA >60 08/13/2011 0540    Lipid Panel     Component Value Date/Time   CHOL 122 02/21/2015 1222   TRIG 139.0 02/21/2015 1222   TRIG 121 12/08/2009   HDL 33.30* 02/21/2015 1222   CHOLHDL 4 02/21/2015 1222   VLDL 27.8 02/21/2015 1222   LDLCALC 61 02/21/2015 1222    CBC    Component Value Date/Time   WBC 10.9* 02/21/2015 1222   RBC 5.03 02/21/2015 1222   HGB 15.4 02/21/2015 1222   HCT 45.6 02/21/2015 1222   PLT 224.0 02/21/2015 1222   MCV 90.6 02/21/2015 1222   MCH 30.2 11/22/2014 1359   MCHC 33.8 02/21/2015 1222   RDW 14.0 02/21/2015 1222   LYMPHSABS 5.3* 11/22/2014 1359   MONOABS 0.6 11/22/2014 1359   EOSABS 0.0 11/22/2014 1359   BASOSABS 0.0 11/22/2014 1359  Hgb A1C Lab Results  Component Value Date   HGBA1C 5.9* 08/10/2011       Assessment & Plan:   Preventative Health Maintenance:  He will be due for pneumovax 05/2015- will get at [facility] Labs normal All HM UTD  RTC in 1 year or sooner if needed

## 2015-02-28 NOTE — Assessment & Plan Note (Signed)
Chronic He is awaiting and [facility] [place], [place] Continue current medications for now Advised him to not buy prescription drugs not prescribed for him

## 2015-03-09 ENCOUNTER — Encounter: Payer: Self-pay | Admitting: Internal Medicine

## 2015-03-15 ENCOUNTER — Telehealth: Payer: Self-pay | Admitting: Internal Medicine

## 2015-03-15 NOTE — Telephone Encounter (Signed)
Pt called wanting to get a referral to [facility] [address] [place], [place] [id number]  p [phone] f [phone]

## 2015-03-15 NOTE — Telephone Encounter (Signed)
Rene KocherRegina, would you like to proceed with this referral? He was just referred to pain clinic to see Dr. Allena KatzPatel on 02/22/15. Please advise!

## 2015-03-15 NOTE — Telephone Encounter (Signed)
no

## 2015-03-18 ENCOUNTER — Telehealth: Payer: Self-pay | Admitting: Internal Medicine

## 2015-03-18 ENCOUNTER — Other Ambulatory Visit: Payer: Self-pay

## 2015-03-18 NOTE — Telephone Encounter (Signed)
Pt left v/m requesting cb. No further message; left v/m requesting pt to cb.

## 2015-03-18 NOTE — Telephone Encounter (Signed)
Pt was seen by pain mgt and they would only suggest increasing neurontin 1200 mg to 1500 mg. Pain mgt would not give rx for oxycodone apap. Pt said both feet are hurting and pain level now is 8-9. pts mother can pick up rx . Advised pt Nicki Reaperegina Baity NP is out of office today and will send request to her and pt will hear back next week. Pt said he understands that Rene KocherRegina does not do long term pain mgt but pt request rx for oxycodone apap due to waiting on referral to [facility]. Advised pt from 03/15/15 answer from Nicki Reaperegina Baity NP she was not going to do another referral since just referred to pain mgt. Pt wants to know why not?pt will wait for answer next week.

## 2015-03-18 NOTE — Telephone Encounter (Signed)
Pt called about rx of pain med and was advised as instructed. Pt wants cb next week with answer why will not do referral.

## 2015-03-18 NOTE — Telephone Encounter (Signed)
Called [facility] and they faxed you the office visit note on this patient, it is in your in box. Please call the patient about his asking to be referred to a New Pain clinic as noone has called him back yet.

## 2015-03-20 NOTE — Telephone Encounter (Signed)
Because I just put in a referral. What is wrong with the pain clinic he just went to?

## 2015-03-21 ENCOUNTER — Telehealth: Payer: Self-pay | Admitting: Internal Medicine

## 2015-03-21 NOTE — Telephone Encounter (Signed)
Mel advised the patient, I will not refill the percocet and I will not keep putting in pain management referral so that he can doctor shop for a doctor that will provide him with what he asks for instead of what they think he needs.

## 2015-03-21 NOTE — Telephone Encounter (Signed)
Pt called checking on his refill for percocet and his referral to pain clinic

## 2015-03-21 NOTE — Telephone Encounter (Signed)
Pt called to check on status of pain referral. I advised pt Douglas KocherRegina has to review [facility] notes and then she will make a decision on whether to proceed with [name]. Pt agreed but really needs for Douglas KocherRegina to do this referral b/c all [facility] wanted to do was increase Neurontin and not refill his Percocet.   Pt also requesting refill for his Percocet. Pt states he mother will come pick up when ready.

## 2015-03-21 NOTE — Telephone Encounter (Signed)
Pt is aware as instructed no new pain mgmt referral as he was just referred and no new Rx as Rene KocherRegina told pt that the refill was a 1 time thing

## 2015-03-28 ENCOUNTER — Other Ambulatory Visit: Payer: Self-pay | Admitting: Infectious Disease

## 2015-04-18 ENCOUNTER — Encounter: Payer: Self-pay | Admitting: Internal Medicine

## 2015-04-18 ENCOUNTER — Encounter: Payer: Self-pay | Admitting: Infectious Disease

## 2015-04-18 ENCOUNTER — Ambulatory Visit (INDEPENDENT_AMBULATORY_CARE_PROVIDER_SITE_OTHER): Payer: 59 | Admitting: Infectious Disease

## 2015-04-18 VITALS — BP 114/78 | HR 62 | Temp 97.8°F | Wt 175.0 lb

## 2015-04-18 DIAGNOSIS — G63 Polyneuropathy in diseases classified elsewhere: Secondary | ICD-10-CM

## 2015-04-18 DIAGNOSIS — Z21 Asymptomatic human immunodeficiency virus [HIV] infection status: Secondary | ICD-10-CM

## 2015-04-18 DIAGNOSIS — R059 Cough, unspecified: Secondary | ICD-10-CM | POA: Insufficient documentation

## 2015-04-18 DIAGNOSIS — R05 Cough: Secondary | ICD-10-CM | POA: Diagnosis not present

## 2015-04-18 DIAGNOSIS — B20 Human immunodeficiency virus [HIV] disease: Secondary | ICD-10-CM | POA: Diagnosis not present

## 2015-04-18 MED ORDER — DOLUTEGRAVIR SODIUM 50 MG PO TABS: 50.0000 mg | ORAL_TABLET | Freq: Every day | ORAL | Status: DC

## 2015-04-18 MED ORDER — EMTRICITABINE-TENOFOVIR DF 200-300 MG PO TABS: 1.0000 | ORAL_TABLET | Freq: Every day | ORAL | Status: DC

## 2015-04-18 NOTE — Progress Notes (Signed)
Subjective:    Patient ID: Douglas ScheuermannJonathan Salazar, male    DOB: 05-09-1971, 44 y.o.   MRN: [id number]  Cough This is a new problem. The current episode started in the past 7 days. Pertinent negatives include no chest pain, chills, fever, myalgias, rash, rhinorrhea, sore throat, shortness of breath or wheezing.    44 year old transgender male with HIV who also has comorbid CAD who has remain perfectly virologically suppressed on Reyataz Norvir and Truvada then on  Tivicay and Truvada.  He is still had nice virological suppression and healthy CD4 cell count.   Lab Results  Component Value Date   HIV1RNAQUANT <20 11/22/2014   Lab Results  Component Value Date   CD4TABS 2090 11/22/2014   CD4TABS 1630 04/13/2014   CD4TABS 1430 02/19/2014    He came today with complaint of cough but was more interested in being rx narcotics for pain from podiatry procedure. I informed him that we would not be rx narcotics anymore.  I began to review HIV labs but noticed they had not been done. He then said that he had to make an appt with Dr. Sharyn Salazar today and did not have time to have them checked. He was under the impression that they were checked in February but I only see labs from PCP having been checked. She has lost #30 by going off of breads and soft drinks going to fruits and vegetables.   Review of Systems  Constitutional: Negative for fever, chills, diaphoresis, appetite change and unexpected weight change.  HENT: Negative for congestion, rhinorrhea, sinus pressure, sneezing, sore throat and trouble swallowing.   Eyes: Negative for photophobia and visual disturbance.  Respiratory: Positive for cough. Negative for chest tightness, shortness of breath, wheezing and stridor.   Cardiovascular: Negative for chest pain, palpitations and leg swelling.  Gastrointestinal: Negative for nausea, vomiting, abdominal pain, diarrhea, constipation, blood in stool, abdominal distention and anal bleeding.   Genitourinary: Negative for dysuria, hematuria, flank pain and difficulty urinating.  Musculoskeletal: Negative for myalgias, back pain, joint swelling and arthralgias.  Skin: Negative for color change, pallor, rash and wound.  Neurological: Positive for numbness. Negative for dizziness, tremors, weakness and light-headedness.  Hematological: Negative for adenopathy. Does not bruise/bleed easily.  Psychiatric/Behavioral: Negative for behavioral problems, confusion, sleep disturbance, dysphoric mood and agitation.       Objective:   Physical Exam  Constitutional: He is oriented to person, place, and time. He appears well-developed and well-nourished. No distress.  HENT:  Head: Normocephalic and atraumatic.  Mouth/Throat: Oropharynx is clear and moist. No oropharyngeal exudate, posterior oropharyngeal edema, posterior oropharyngeal erythema or tonsillar abscesses.  Eyes: Conjunctivae and EOM are normal. Pupils are equal, round, and reactive to light.  Neck: Normal range of motion. Neck supple.  Cardiovascular: Normal rate and regular rhythm.   Pulmonary/Chest: Effort normal. No respiratory distress.  Abdominal: He exhibits no distension.  Musculoskeletal: He exhibits no edema or tenderness.  Neurological: He is alert and oriented to person, place, and time. He has normal reflexes. Coordination normal.  Skin: Skin is warm and dry. He is not diaphoretic. No erythema. No pallor.  Psychiatric: He has a normal mood and affect. His behavior is normal. Judgment and thought content normal.          Assessment & Plan:  HIV: Continue TIVICAY and Truvada, bring him back in 2 months for repeat CD4 count and viral load and then again in 6 months with visit   Painful neuropathy: gabapentin, no narcotics  Cough: did not give me time to evaluate this

## 2015-04-26 ENCOUNTER — Other Ambulatory Visit: Payer: Self-pay | Admitting: Infectious Disease

## 2015-07-27 ENCOUNTER — Other Ambulatory Visit: Payer: Self-pay | Admitting: Infectious Disease

## 2015-07-27 DIAGNOSIS — G629 Polyneuropathy, unspecified: Secondary | ICD-10-CM

## 2015-08-09 ENCOUNTER — Telehealth: Payer: Self-pay | Admitting: *Deleted

## 2015-08-09 NOTE — Telephone Encounter (Signed)
Made appt for RW/ADAP renewal, 08/15/15.

## 2015-08-12 ENCOUNTER — Other Ambulatory Visit: Payer: Self-pay | Admitting: Infectious Disease

## 2015-08-15 ENCOUNTER — Ambulatory Visit: Payer: Self-pay

## 2015-08-24 ENCOUNTER — Telehealth: Payer: Self-pay | Admitting: *Deleted

## 2015-08-24 ENCOUNTER — Other Ambulatory Visit: Payer: Self-pay | Admitting: Infectious Disease

## 2015-08-24 NOTE — Telephone Encounter (Signed)
Patient requesting refills of gabapentin, but wants only to speak with Annice Pih or Tamika regarding this.  RN advised I could easily send in refills during this call, he said "I don't want to speak with you [name] or have you do anything for me."  RN clarified that patient wants another clinic nurse to call him regarding this refill request, that he does not want me to send it in.  He stated "Yes, that is correct." Andree Coss, RN

## 2015-08-24 NOTE — Telephone Encounter (Signed)
Per Dr. Daiva Eves patient will have to wait until either myself or Tamika are in triage to refill this medication. As both of [place] are busy working with clinic MDs.

## 2015-08-26 ENCOUNTER — Other Ambulatory Visit: Payer: Self-pay | Admitting: Licensed Clinical Social Worker

## 2015-08-26 MED ORDER — GABAPENTIN 800 MG PO TABS: 800.0000 mg | ORAL_TABLET | Freq: Three times a day (TID) | ORAL | Status: DC

## 2015-09-12 ENCOUNTER — Other Ambulatory Visit: Payer: Self-pay | Admitting: *Deleted

## 2015-09-12 DIAGNOSIS — G629 Polyneuropathy, unspecified: Secondary | ICD-10-CM

## 2015-09-12 MED ORDER — GABAPENTIN 400 MG PO CAPS: 400.0000 mg | ORAL_CAPSULE | Freq: Three times a day (TID) | ORAL | Status: DC

## 2015-09-15 ENCOUNTER — Telehealth: Payer: Self-pay | Admitting: *Deleted

## 2015-09-15 NOTE — Telephone Encounter (Signed)
[facility] calling to confirm patient's gabapentin dosage.  Patient has 2 prescriptions,  TID and  TID for the maximum daily dosage of  TID.  They will not fill this medication without confirmation from Dr. Daiva Eves. Please advise. [facility]: [phone]. Andree Coss, RN

## 2015-09-15 NOTE — Telephone Encounter (Signed)
Lets find out what the patient is taking right now first

## 2015-09-16 ENCOUNTER — Other Ambulatory Visit: Payer: Self-pay | Admitting: *Deleted

## 2015-09-16 DIAGNOSIS — G629 Polyneuropathy, unspecified: Secondary | ICD-10-CM

## 2015-09-16 MED ORDER — GABAPENTIN 400 MG PO CAPS: 400.0000 mg | ORAL_CAPSULE | Freq: Three times a day (TID) | ORAL | Status: DC

## 2015-09-16 MED ORDER — GABAPENTIN 800 MG PO TABS: 800.0000 mg | ORAL_TABLET | Freq: Three times a day (TID) | ORAL | Status: DC

## 2015-09-16 NOTE — Telephone Encounter (Signed)
RN called patient to clarify the gabapentin.  The patient refused to speak with this RN once identified.  RN advised that this call was per Dr. Daiva Eves regarding his gabapentin refill and if he wanted to speak with someone else, it could be next week before the call was made.  The patient stated "I don't care" and disconnected the call.

## 2015-09-16 NOTE — Telephone Encounter (Signed)
That is fine to fill it as such one 800 and one  TID that is the MAXIMUM dose of gabapentin at  tid thanks [name]!

## 2015-09-16 NOTE — Telephone Encounter (Signed)
Per ok from Dr Daiva Eves medication sent to the pharmacy.

## 2015-09-16 NOTE — Telephone Encounter (Addendum)
Patient called the office to clarify his dose of Gabapentin as per previous phone call. Patient advised he takes 1200 mg TID (1 800 mg and 1 400 mg tab). He advised he was prescribed this and has been taking it this was for a while. Gave the patient an appt to see the doctor and discuss future use and for a general follow up.  The patient request that RN [name] does not call him anymore. He advised they do not get along and he does not like the way she questions him or speaks to him. Advised will let someone know.

## 2015-10-10 ENCOUNTER — Encounter: Payer: Self-pay | Admitting: Infectious Disease

## 2015-10-10 ENCOUNTER — Ambulatory Visit (INDEPENDENT_AMBULATORY_CARE_PROVIDER_SITE_OTHER): Payer: Self-pay | Admitting: Infectious Disease

## 2015-10-10 VITALS — BP 119/80 | HR 55 | Temp 98.0°F | Wt 177.0 lb

## 2015-10-10 DIAGNOSIS — G63 Polyneuropathy in diseases classified elsewhere: Secondary | ICD-10-CM

## 2015-10-10 DIAGNOSIS — F4321 Adjustment disorder with depressed mood: Secondary | ICD-10-CM

## 2015-10-10 DIAGNOSIS — F4323 Adjustment disorder with mixed anxiety and depressed mood: Secondary | ICD-10-CM

## 2015-10-10 DIAGNOSIS — F4320 Adjustment disorder, unspecified: Secondary | ICD-10-CM

## 2015-10-10 DIAGNOSIS — Z23 Encounter for immunization: Secondary | ICD-10-CM

## 2015-10-10 DIAGNOSIS — B20 Human immunodeficiency virus [HIV] disease: Secondary | ICD-10-CM

## 2015-10-10 DIAGNOSIS — E785 Hyperlipidemia, unspecified: Secondary | ICD-10-CM

## 2015-10-10 DIAGNOSIS — I2510 Atherosclerotic heart disease of native coronary artery without angina pectoris: Secondary | ICD-10-CM

## 2015-10-10 HISTORY — DX: Adjustment disorder with mixed anxiety and depressed mood: F43.23

## 2015-10-10 HISTORY — DX: Adjustment disorder, unspecified: F43.20

## 2015-10-10 LAB — COMPLETE METABOLIC PANEL WITH GFR
ALBUMIN: 4.8 g/dL (ref 3.6–5.1)
ALK PHOS: 88 U/L (ref 40–115)
ALT: 20 U/L (ref 9–46)
AST: 19 U/L (ref 10–40)
BILIRUBIN TOTAL: 0.5 mg/dL (ref 0.2–1.2)
BUN: 8 mg/dL (ref 7–25)
CALCIUM: 9.9 mg/dL (ref 8.6–10.3)
CO2: 26 mmol/L (ref 20–31)
CREATININE: 0.88 mg/dL (ref 0.60–1.35)
Chloride: 106 mmol/L (ref 98–110)
GFR, Est African American: >89 mL/min (ref >=60)
GFR, Est Non African American: >89 mL/min (ref >=60)
GLUCOSE: 98 mg/dL (ref 65–99)
POTASSIUM: 4.8 mmol/L (ref 3.5–5.3)
SODIUM: 140 mmol/L (ref 135–146)
TOTAL PROTEIN: 7.6 g/dL (ref 6.1–8.1)

## 2015-10-10 LAB — CBC WITH DIFFERENTIAL/PLATELET
BASOS PCT: 0 % (ref 0–1)
Basophils Absolute: 0 10*3/uL (ref 0.0–0.1)
EOS ABS: 0 10*3/uL (ref 0.0–0.7)
EOS PCT: 0 % (ref 0–5)
HCT: 46.4 % (ref 39.0–52.0)
HEMOGLOBIN: 15.5 g/dL (ref 13.0–17.0)
Lymphocytes Relative: 47 % — ABNORMAL HIGH (ref 12–46)
Lymphs Abs: 5.7 10*3/uL — ABNORMAL HIGH (ref 0.7–4.0)
MCH: 29.9 pg (ref 26.0–34.0)
MCHC: 33.4 g/dL (ref 30.0–36.0)
MCV: 89.6 fL (ref 78.0–100.0)
MONO ABS: 0.6 10*3/uL (ref 0.1–1.0)
MONOS PCT: 5 % (ref 3–12)
MPV: 10.6 fL (ref 8.6–12.4)
Neutro Abs: 5.8 10*3/uL (ref 1.7–7.7)
Neutrophils Relative %: 48 % (ref 43–77)
Platelets: 236 10*3/uL (ref 150–400)
RBC: 5.18 MIL/uL (ref 4.22–5.81)
RDW: 13.6 % (ref 11.5–15.5)
WBC: 12.1 10*3/uL — AB (ref 4.0–10.5)

## 2015-10-10 MED ORDER — EMTRICITABINE-TENOFOVIR AF 200-25 MG PO TABS: 1.0000 | ORAL_TABLET | Freq: Every day | ORAL | Status: DC

## 2015-10-10 MED ORDER — ESCITALOPRAM OXALATE 10 MG PO TABS: 10.0000 mg | ORAL_TABLET | Freq: Every day | ORAL | Status: DC

## 2015-10-10 MED ORDER — DOLUTEGRAVIR SODIUM 50 MG PO TABS: 50.0000 mg | ORAL_TABLET | Freq: Every day | ORAL | Status: DC

## 2015-10-10 MED ORDER — ATORVASTATIN CALCIUM 20 MG PO TABS: 20.0000 mg | ORAL_TABLET | Freq: Every day | ORAL | Status: DC

## 2015-10-10 NOTE — Progress Notes (Signed)
Chief complaint: chronic neuropathic pain, insomnia anxiety  Subjective:    Patient ID: Douglas Salazar, male    DOB: [birth year] Salazar y.o.   MRN: [id number]  HPI  44 year old transgender male with HIV who also has comorbid CAD who has remain perfectly virologically suppressed on Reyataz Norvir and Truvada then on  Tivicay and Truvada.  He is still had nice virological suppression and healthy CD4 cell count.   Lab Results  Component Value Date   HIV1RNAQUANT <20 11/22/2014   Lab Results  Component Value Date   CD4TABS 2090 11/22/2014   CD4TABS 1630 04/13/2014   CD4TABS 1430 02/19/2014    Douglas Salazar has several issues recently. As described in prior notes we stopped her IV narcotics due to pain contract violation.  Today he is suffering some significant anxiety due to the fact that his father has several recurrence of his apparent metastatic prostate cancer. Apparently on hospice. Douglas Salazar  was asking for something for anxiety. We will start him on a selective serotonin reuptake inhibitor.  He is also continue suffer from neuropathic pain and we have increased his gabapentin to maximum dose.  He is adamant that our RN Douglas Salazar not be involved in his care and we will try to accommodate that wish by having Douglas Salazar have other RN or CMA handles his calls, requests etc. He became animated and had to be talked down and he was making threats to have his mother have Michelle's RN license removed for making unfounded allegations.   I have spoken since with Douglas Salazar our clinic manager, Douglas Salazar and other staff and as above will try to manage this by not having him and Douglas Salazar work together.  Past Medical History  Diagnosis Date  . AIDS (HCC)   . 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 CAD (coronary artery disease)   . Dysphagia   . Hyperlipidemia   . Myocardial infarction (HCC)   . Neuromuscular disorder (HCC)   . Substance abuse   . GERD (gastroesophageal reflux disease)   .  Depression   . Hypertension   . Anxiety     Past Surgical History  Procedure Laterality Date  . Endovascular stent insertion      Family History  Problem Relation Age of Onset  . Hypertension Mother   . Heart disease Father   . Hypertension Father   . Hearing loss Paternal Grandfather   . Cancer Neg Hx   . Diabetes Neg Hx   . Stroke Neg Hx       Social History   Social History  . Marital Status: Single    Spouse Name: N/A  . Number of Children: N/A  . Years of Education: N/A   Social History Main Topics  . Smoking status: Former Smoker    Quit date: 05/31/2012  . Smokeless tobacco: Never Used     Comment: pt no longer smokes  . Alcohol Use: No  . Drug Use: No  . Sexual Activity: Not Currently     Comment: pt. declined condoms   Other Topics Concern  . None   Social History Narrative    Allergies  Allergen Reactions  . Naproxen   . Sulfonamide Derivatives      Current outpatient prescriptions:  .  atorvastatin (LIPITOR) 20 MG tablet, Take 1 tablet (20 mg total) by mouth daily., Disp: 30 tablet, Rfl: 6 .  dolutegravir (TIVICAY) 50 MG tablet, Take 1 tablet (50 mg total) by mouth daily., Disp: 30 tablet, Rfl: 11 .  gabapentin (NEURONTIN) 400 MG capsule, Take 1 capsule (400 mg total) by mouth 3 (three) times daily., Disp: 90 capsule, Rfl: 5 .  gabapentin (NEURONTIN) 800 MG tablet, Take 1 tablet (800 mg total) by mouth 3 (three) times daily., Disp: 90 tablet, Rfl: 5 .  spironolactone (ALDACTONE) 25 MG tablet, Take 1 tablet (25 mg total) by mouth daily., Disp: 90 tablet, Rfl: 1 .  emtricitabine-tenofovir AF (DESCOVY) 200-25 MG tablet, Take 1 tablet by mouth daily., Disp: 30 tablet, Rfl: 11 .  escitalopram (LEXAPRO) 10 MG tablet, Take 1 tablet (10 mg total) by mouth daily., Disp: 30 tablet, Rfl: 11 .  oxyCODONE-acetaminophen (PERCOCET) 10-325 MG per tablet, Take 1 tablet by mouth 2 (two) times daily. (Patient not taking: Reported on 10/10/2015), Disp: 60 tablet,  Rfl: 0   Review of Systems  Constitutional: Negative for diaphoresis, appetite change and unexpected weight change.  HENT: Negative for congestion, sinus pressure, sneezing and trouble swallowing.   Eyes: Negative for photophobia and visual disturbance.  Respiratory: Negative for chest tightness and stridor.   Cardiovascular: Negative for palpitations and leg swelling.  Gastrointestinal: Negative for nausea, vomiting, abdominal pain, diarrhea, constipation, blood in stool, abdominal distention and anal bleeding.  Genitourinary: Negative for dysuria, hematuria, flank pain and difficulty urinating.  Musculoskeletal: Negative for back pain, joint swelling and arthralgias.  Skin: Negative for color change, pallor and wound.  Neurological: Positive for numbness. Negative for dizziness, tremors, weakness and light-headedness.  Hematological: Negative for adenopathy. Does not bruise/bleed easily.  Psychiatric/Behavioral: Positive for dysphoric mood and decreased concentration. Negative for suicidal ideas, behavioral problems, confusion, sleep disturbance and agitation.       Objective:   Physical Exam  Constitutional: He is oriented to person, place, and time. He appears well-developed and well-nourished. No distress.  HENT:  Head: Normocephalic and atraumatic.  Mouth/Throat: Oropharynx is clear and moist. No oropharyngeal exudate, posterior oropharyngeal edema, posterior oropharyngeal erythema or tonsillar abscesses.  Eyes: Conjunctivae and EOM are normal. Pupils are equal, round, and reactive to light.  Neck: Normal range of motion. Neck supple.  Cardiovascular: Normal rate and regular rhythm.   Pulmonary/Chest: Effort normal. No respiratory distress.  Abdominal: He exhibits no distension.  Musculoskeletal: He exhibits no edema or tenderness.  Neurological: He is alert and oriented to person, place, and time. He has normal reflexes. Coordination normal.  Skin: Skin is warm and dry. He is  not diaphoretic. No erythema. No pallor.  Psychiatric: He has a normal mood and affect. His behavior is normal. Judgment and thought content normal.          Assessment & Plan:  HIV: Continue TIVICAY and exchange Truvada for Descovy, will bring him back in 2 months for repeat CD4 count and viral load and then again in 6 months with visit   Painful neuropathy: gabapentin, no narcotics  Anxiety, depression, grieving, insomnia: start lexapro 10 mg offered to have him see any but he stated that he does not derive benefit from talking to [place].  Grieving: Again supportive therapy and starting SSRI  CAD: On statin, aldactone  I spent greater than 40 minutes with the patient including greater than 50% of time in face to face counsel of the patient during his HIV is intervertebral regimen neuropathy as anxiety depression insomnia grieving coronary disease and in coordination of their care.

## 2015-10-11 LAB — T-HELPER CELL (CD4) - (RCID CLINIC ONLY)
CD4 T CELL ABS: 1920 /uL (ref 400–2700)
CD4 T CELL HELPER: 32 % — AB (ref 33–55)

## 2015-10-11 LAB — RPR

## 2015-10-12 LAB — HIV-1 RNA QUANT-NO REFLEX-BLD
HIV 1 RNA Quant: 57 copies/mL — ABNORMAL HIGH (ref <20)
HIV-1 RNA Quant, Log: 1.76 {Log} — ABNORMAL HIGH (ref <1.30)

## 2015-10-13 ENCOUNTER — Telehealth: Payer: Self-pay | Admitting: *Deleted

## 2015-10-13 NOTE — Telephone Encounter (Signed)
Patient called stating he was having [facility] I called [facility] and they said his ADAP was not active. I spoke with Harl Bowie[place], Oceanographerfinancial assistant and she called [place] to check on it. She was able to get it approved by phone and I let patient know. He will call [facility] tomorrow to arrange shipment of this medication. Wendall MolaJacqueline Carmen Vallecillo

## 2015-11-14 ENCOUNTER — Telehealth: Payer: Self-pay | Admitting: *Deleted

## 2015-11-14 NOTE — Telephone Encounter (Signed)
Patient called and requested that his last lab results be faxed to Dr. Sharyn LullHarwani, cardiologist at [phone]. Labs faxed Wendall MolaJacqueline Gwyn Mehring

## 2016-01-30 ENCOUNTER — Other Ambulatory Visit: Payer: Self-pay | Admitting: *Deleted

## 2016-01-30 DIAGNOSIS — I2510 Atherosclerotic heart disease of native coronary artery without angina pectoris: Secondary | ICD-10-CM

## 2016-01-30 MED ORDER — SPIRONOLACTONE 25 MG PO TABS
25.0000 mg | ORAL_TABLET | Freq: Every day | ORAL | Status: DC
Start: 2016-01-30 — End: 2016-08-23

## 2016-03-05 ENCOUNTER — Other Ambulatory Visit: Payer: Self-pay | Admitting: Infectious Disease

## 2016-03-05 DIAGNOSIS — G629 Polyneuropathy, unspecified: Secondary | ICD-10-CM

## 2016-03-14 ENCOUNTER — Ambulatory Visit: Payer: Self-pay

## 2016-05-01 ENCOUNTER — Other Ambulatory Visit: Payer: Self-pay | Admitting: Infectious Disease

## 2016-06-25 ENCOUNTER — Other Ambulatory Visit: Payer: Self-pay | Admitting: Infectious Disease

## 2016-07-09 ENCOUNTER — Ambulatory Visit: Payer: Self-pay

## 2016-07-09 ENCOUNTER — Other Ambulatory Visit: Payer: Self-pay | Admitting: Licensed Clinical Social Worker

## 2016-07-09 DIAGNOSIS — K219 Gastro-esophageal reflux disease without esophagitis: Secondary | ICD-10-CM

## 2016-07-09 MED ORDER — PANTOPRAZOLE SODIUM 40 MG PO TBEC: 40.0000 mg | DELAYED_RELEASE_TABLET | Freq: Every day | ORAL | Status: DC

## 2016-07-11 ENCOUNTER — Other Ambulatory Visit: Payer: Self-pay | Admitting: Licensed Clinical Social Worker

## 2016-07-11 DIAGNOSIS — K219 Gastro-esophageal reflux disease without esophagitis: Secondary | ICD-10-CM

## 2016-07-11 MED ORDER — OMEPRAZOLE 20 MG PO CPDR: 20.0000 mg | DELAYED_RELEASE_CAPSULE | Freq: Every day | ORAL | Status: DC

## 2016-07-12 ENCOUNTER — Other Ambulatory Visit: Payer: Self-pay | Admitting: Infectious Disease

## 2016-08-01 ENCOUNTER — Encounter: Payer: Self-pay | Admitting: Infectious Disease

## 2016-08-23 ENCOUNTER — Telehealth: Payer: Self-pay | Admitting: *Deleted

## 2016-08-23 DIAGNOSIS — I2510 Atherosclerotic heart disease of native coronary artery without angina pectoris: Secondary | ICD-10-CM

## 2016-08-23 MED ORDER — SPIRONOLACTONE 25 MG PO TABS: 25.0000 mg | ORAL_TABLET | Freq: Every day | ORAL | 1 refills | Status: DC

## 2016-08-23 NOTE — Telephone Encounter (Signed)
Clarified which medication needed refilled.  Also, patient needing MD f/u appt.  Appt made.

## 2016-08-27 ENCOUNTER — Other Ambulatory Visit: Payer: Self-pay | Admitting: Infectious Disease

## 2016-08-28 ENCOUNTER — Other Ambulatory Visit: Payer: Self-pay | Admitting: Infectious Disease

## 2016-08-29 ENCOUNTER — Other Ambulatory Visit: Payer: Self-pay | Admitting: Licensed Clinical Social Worker

## 2016-08-29 DIAGNOSIS — B20 Human immunodeficiency virus [HIV] disease: Secondary | ICD-10-CM

## 2016-08-29 DIAGNOSIS — K219 Gastro-esophageal reflux disease without esophagitis: Secondary | ICD-10-CM

## 2016-08-29 MED ORDER — OMEPRAZOLE 20 MG PO CPDR: 20.0000 mg | DELAYED_RELEASE_CAPSULE | Freq: Every day | ORAL | 11 refills | Status: DC

## 2016-08-29 MED ORDER — DOLUTEGRAVIR SODIUM 50 MG PO TABS: 50.0000 mg | ORAL_TABLET | Freq: Every day | ORAL | 11 refills | Status: DC

## 2016-08-29 MED ORDER — EMTRICITABINE-TENOFOVIR AF 200-25 MG PO TABS: 1.0000 | ORAL_TABLET | Freq: Every day | ORAL | 11 refills | Status: DC

## 2016-09-13 ENCOUNTER — Telehealth: Payer: Self-pay | Admitting: *Deleted

## 2016-09-13 NOTE — Telephone Encounter (Signed)
Patient called requesting a referral to [facility] in [place]. Phone [phone]. Notes, demographics, and insurance info faxed to [phone]. Douglas MolaJacqueline Lyndsy Gilberto

## 2016-09-13 NOTE — Telephone Encounter (Signed)
Thanks so much Jackie! 

## 2016-09-20 ENCOUNTER — Other Ambulatory Visit: Payer: Self-pay | Admitting: Infectious Disease

## 2016-10-15 ENCOUNTER — Ambulatory Visit: Payer: Self-pay | Admitting: Infectious Disease

## 2016-10-17 ENCOUNTER — Ambulatory Visit: Payer: Self-pay | Admitting: Infectious Disease

## 2016-10-22 ENCOUNTER — Telehealth: Payer: Self-pay | Admitting: Licensed Clinical Social Worker

## 2016-10-22 NOTE — Telephone Encounter (Signed)
Patient called concerned of a headache that started after taking Omni drops for weight loss. He started Friday 10/19/16 and did not retake it. Cassie K in pharmacy checked that ingredients in the drug to rule out drug interactions with his HIV drugs and there was none.

## 2016-10-29 ENCOUNTER — Emergency Department (HOSPITAL_COMMUNITY)
Admission: EM | Admit: 2016-10-29 | Discharge: 2016-10-30 | Disposition: A | Discharge status: Home / Self Care | Payer: BLUE CROSS/BLUE SHIELD | Attending: Dermatology | Admitting: Dermatology

## 2016-10-29 ENCOUNTER — Encounter (HOSPITAL_COMMUNITY): Payer: Self-pay | Admitting: *Deleted

## 2016-10-29 DIAGNOSIS — Z5321 Procedure and treatment not carried out due to patient leaving prior to being seen by health care provider: Secondary | ICD-10-CM | POA: Insufficient documentation

## 2016-10-29 DIAGNOSIS — I2510 Atherosclerotic heart disease of native coronary artery without angina pectoris: Secondary | ICD-10-CM | POA: Diagnosis not present

## 2016-10-29 DIAGNOSIS — I252 Old myocardial infarction: Secondary | ICD-10-CM | POA: Diagnosis not present

## 2016-10-29 DIAGNOSIS — I10 Essential (primary) hypertension: Secondary | ICD-10-CM | POA: Insufficient documentation

## 2016-10-29 DIAGNOSIS — R29810 Facial weakness: Secondary | ICD-10-CM | POA: Insufficient documentation

## 2016-10-29 DIAGNOSIS — Z87891 Personal history of nicotine dependence: Secondary | ICD-10-CM | POA: Insufficient documentation

## 2016-10-29 LAB — CBC WITH DIFFERENTIAL/PLATELET
BAND NEUTROPHILS: 0 %
BLASTS: 0 %
Basophils Absolute: 0.1 10*3/uL (ref 0.0–0.1)
Basophils Relative: 1 %
EOS ABS: 0.1 10*3/uL (ref 0.0–0.7)
Eosinophils Relative: 1 %
HEMATOCRIT: 39.9 % (ref 39.0–52.0)
HEMOGLOBIN: 13.4 g/dL (ref 13.0–17.0)
LYMPHS PCT: 41 %
Lymphs Abs: 4.6 10*3/uL — ABNORMAL HIGH (ref 0.7–4.0)
MCH: 29.3 pg (ref 26.0–34.0)
MCHC: 33.6 g/dL (ref 30.0–36.0)
MCV: 87.1 fL (ref 78.0–100.0)
Metamyelocytes Relative: 0 %
Monocytes Absolute: 1 10*3/uL (ref 0.1–1.0)
Monocytes Relative: 9 %
Myelocytes: 0 %
NEUTROS PCT: 48 %
Neutro Abs: 5.5 10*3/uL (ref 1.7–7.7)
OTHER: 0 %
PROMYELOCYTES ABS: 0 %
Platelets: 357 10*3/uL (ref 150–400)
RBC: 4.58 MIL/uL (ref 4.22–5.81)
RDW: 14.7 % (ref 11.5–15.5)
WBC: 11.3 10*3/uL — ABNORMAL HIGH (ref 4.0–10.5)
nRBC: 0 /100 WBC

## 2016-10-29 LAB — COMPREHENSIVE METABOLIC PANEL
ALBUMIN: 3.4 g/dL — AB (ref 3.5–5.0)
ALK PHOS: 100 U/L (ref 38–126)
ALT: 35 U/L (ref 17–63)
ANION GAP: 7 (ref 5–15)
AST: 26 U/L (ref 15–41)
BILIRUBIN TOTAL: 0.5 mg/dL (ref 0.3–1.2)
BUN: 7 mg/dL (ref 6–20)
CALCIUM: 9 mg/dL (ref 8.9–10.3)
CO2: 24 mmol/L (ref 22–32)
CREATININE: 1.05 mg/dL (ref 0.61–1.24)
Chloride: 106 mmol/L (ref 101–111)
GFR calc Af Amer: >60 mL/min (ref >60)
GFR calc non Af Amer: >60 mL/min (ref >60)
GLUCOSE: 114 mg/dL — AB (ref 65–99)
Potassium: 3.9 mmol/L (ref 3.5–5.1)
Sodium: 137 mmol/L (ref 135–145)
TOTAL PROTEIN: 7.7 g/dL (ref 6.5–8.1)

## 2016-10-29 NOTE — ED Notes (Signed)
Called patient for CT. Unable to locate patient at this time.

## 2016-10-29 NOTE — ED Notes (Signed)
PA Allyne GeeSanders does not feel pt is having stroke.  Ordered CT and labs.

## 2016-10-29 NOTE — ED Triage Notes (Signed)
Pt states he awoke with L ear pain, L sided headache and L facial droop.  Symptoms are getting worse.

## 2016-12-05 ENCOUNTER — Ambulatory Visit (INDEPENDENT_AMBULATORY_CARE_PROVIDER_SITE_OTHER): Payer: BLUE CROSS/BLUE SHIELD | Admitting: Infectious Disease

## 2016-12-05 ENCOUNTER — Encounter: Payer: Self-pay | Admitting: Infectious Disease

## 2016-12-05 VITALS — BP 124/83 | HR 64 | Temp 97.6°F | Wt 219.0 lb

## 2016-12-05 DIAGNOSIS — F69 Unspecified disorder of adult personality and behavior: Secondary | ICD-10-CM

## 2016-12-05 DIAGNOSIS — F489 Nonpsychotic mental disorder, unspecified: Secondary | ICD-10-CM

## 2016-12-05 DIAGNOSIS — G510 Bell's palsy: Secondary | ICD-10-CM

## 2016-12-05 DIAGNOSIS — I2510 Atherosclerotic heart disease of native coronary artery without angina pectoris: Secondary | ICD-10-CM | POA: Diagnosis not present

## 2016-12-05 DIAGNOSIS — Z23 Encounter for immunization: Secondary | ICD-10-CM

## 2016-12-05 DIAGNOSIS — B20 Human immunodeficiency virus [HIV] disease: Secondary | ICD-10-CM | POA: Diagnosis not present

## 2016-12-05 DIAGNOSIS — I10 Essential (primary) hypertension: Secondary | ICD-10-CM

## 2016-12-05 HISTORY — DX: Bell's palsy: G51.0

## 2016-12-05 HISTORY — DX: Essential (primary) hypertension: I10

## 2016-12-05 MED ORDER — NITROGLYCERIN 0.4 MG SL SUBL: 0.4000 mg | SUBLINGUAL_TABLET | SUBLINGUAL | 0 refills | Status: DC | PRN

## 2016-12-05 MED ORDER — LISINOPRIL 20 MG PO TABS: 20.0000 mg | ORAL_TABLET | Freq: Every day | ORAL | 11 refills | Status: DC

## 2016-12-05 NOTE — Patient Instructions (Signed)
I would like you to see pharmacy after change to lisinopril

## 2016-12-05 NOTE — Progress Notes (Signed)
Chief complaint: SHe suffered from Bell's palsy was in the ER recently still with left-sided facial droop also complaining that I need to refill his nitroglycerin and his aldosterone. SHe has not been seen in the clinic for more than a year  Subjective:    Patient ID: Douglas Salazar, male    DOB: [birth year], 45 y.o.   MRN: [id number]  HPI  45 year old transgender male with HIV who also has comorbid CAD who has remain perfectly virologically suppressed on Reyataz Norvir and Truvada then on  Tivicay and Truvada THEN TIVICAY and DESCOVY  Lab Results  Component Value Date   HIV1RNAQUANT 57 (H) 10/10/2015   HIV1RNAQUANT <20 11/22/2014   HIV1RNAQUANT 65 (H) 04/13/2014       Lab Results  Component Value Date   CD4TABS 1,920 10/10/2015   CD4TABS 2,090 11/22/2014   CD4TABS 1,630 04/13/2014   Past Medical History:  Diagnosis Date  . Adjustment disorder with mixed anxiety and depressed mood 10/10/2015  . AIDS (HCC)   . Anticipatory grieving 10/10/2015  . Anxiety   . CAD (coronary artery disease)   . Depression   . Dysphagia   . GERD (gastroesophageal reflux disease)   . 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 Hyperlipidemia   . Hypertension   . Myocardial infarction   . Neuromuscular disorder (HCC)   . Substance abuse     Past Surgical History:  Procedure Laterality Date  . ENDOVASCULAR STENT INSERTION      Family History  Problem Relation Age of Onset  . Hypertension Mother   . Heart disease Father   . Hypertension Father   . Hearing loss Paternal Grandfather   . Cancer Neg Hx   . Diabetes Neg Hx   . Stroke Neg Hx       Social History   Social History  . Marital status: Single    Spouse name: N/A  . Number of children: N/A  . Years of education: N/A   Social History Main Topics  . Smoking status: Former Smoker    Quit date: 05/31/2012  . Smokeless tobacco: Never Used     Comment: pt no longer smokes  . Alcohol use No  . Drug use: No  . Sexual  activity: Not Currently     Comment: pt. declined condoms   Other Topics Concern  . None   Social History Narrative  . None    Allergies  Allergen Reactions  . Naproxen   . Sulfonamide Derivatives      Current Outpatient Prescriptions:  .  atorvastatin (LIPITOR) 20 MG tablet, TAKE 1 TABLET BY MOUTH EVERY DAY, Disp: 30 tablet, Rfl: 3 .  dolutegravir (TIVICAY) 50 MG tablet, Take 1 tablet (50 mg total) by mouth daily., Disp: 30 tablet, Rfl: 11 .  emtricitabine-tenofovir AF (DESCOVY) 200-25 MG tablet, Take 1 tablet by mouth daily., Disp: 30 tablet, Rfl: 11 .  escitalopram (LEXAPRO) 10 MG tablet, Take 1 tablet (10 mg total) by mouth daily., Disp: 30 tablet, Rfl: 11 .  gabapentin (NEURONTIN) 400 MG capsule, TAKE 1 CAPSULE BY MOUTH THREE TIMES DAILY, Disp: 90 capsule, Rfl: 5 .  gabapentin (NEURONTIN) 800 MG tablet, TAKE 1 TABLET(800 MG) BY MOUTH THREE TIMES DAILY, Disp: 90 tablet, Rfl: 5 .  omeprazole (PRILOSEC) 20 MG capsule, Take 1 capsule (20 mg total) by mouth daily., Disp: 30 capsule, Rfl: 11 .  oxyCODONE-acetaminophen (PERCOCET) 10-325 MG per tablet, Take 1 tablet by mouth 2 (two) times daily., Disp: 60 tablet,  Rfl: 0 .  spironolactone (ALDACTONE) 25 MG tablet, Take 1 tablet (25 mg total) by mouth daily., Disp: 90 tablet, Rfl: 1   Review of Systems  Constitutional: Negative for appetite change, diaphoresis and unexpected weight change.  HENT: Negative for congestion, sinus pressure, sneezing and trouble swallowing.   Eyes: Negative for photophobia and visual disturbance.  Respiratory: Negative for chest tightness and stridor.   Cardiovascular: Negative for palpitations and leg swelling.  Gastrointestinal: Negative for abdominal distention, abdominal pain, anal bleeding, blood in stool, constipation, diarrhea, nausea and vomiting.  Genitourinary: Negative for difficulty urinating, dysuria, flank pain and hematuria.  Musculoskeletal: Negative for arthralgias, back pain and joint  swelling.  Skin: Negative for color change, pallor and wound.  Neurological: Positive for facial asymmetry and numbness. Negative for dizziness, tremors, weakness and light-headedness.  Hematological: Negative for adenopathy. Does not bruise/bleed easily.  Psychiatric/Behavioral: Positive for decreased concentration and dysphoric mood. Negative for agitation, behavioral problems, confusion, sleep disturbance and suicidal ideas.       Objective:   Physical Exam  Constitutional: He is oriented to person, place, and time. He appears well-developed and well-nourished. No distress.  HENT:  Head: Normocephalic and atraumatic.  Mouth/Throat: Oropharynx is clear and moist. No oropharyngeal exudate, posterior oropharyngeal edema, posterior oropharyngeal erythema or tonsillar abscesses.  Eyes: Conjunctivae and EOM are normal. Pupils are equal, round, and reactive to light.  Neck: Normal range of motion. Neck supple.  Cardiovascular: Normal rate and regular rhythm.   Pulmonary/Chest: Effort normal. No respiratory distress.  Abdominal: He exhibits no distension.  Musculoskeletal: He exhibits no edema or tenderness.  Neurological: He is alert and oriented to person, place, and time. He has normal reflexes. A cranial nerve deficit is present. Coordination normal.  Has a left-sided facial droop consistent with Bell's palsy  Skin: Skin is warm and dry. He is not diaphoretic. No erythema. No pallor.  Psychiatric: His behavior is normal. Judgment and thought content normal. His affect is labile and inappropriate.  Douglas Salazar is easily angered and my pain is fairly disrespectful to me and other staff but this is not new behavior on HER/HIS part          Assessment & Plan:  HIV: Continue TIVICAY and  Descovy, Check labs today.  Coronary artery disease: SHE/He is supposed to be on a statin and what has been on all tasks   CAD: On statin, aldactone. Committed changing from the Aldactone to an ACE  inhibitor will make the change once he runs out of his 90 a supply needing come back for repeat BMP.  She/he insisted that I needed to fill the nitroglycerin for him/her due to the fact that only undermined [facility] with [facility] something I find hard to believe. I feel this medicine should be more properly prescribed by his cardiologist but I will give him a protrusion for nitroglycerin with 0 refills and instructions to the pharmacy to have Dr. her one he filled the ensuing refills  Transgender identity: Not clear whether [name]  identifies as a woman her manage this point time.   Belligerence: Douglas Salazar has been pretty difficult in his insistence for example that he cannot be talked to by one of our nurses [name] and in his attitude towards me. I'm not sure how well we can maintain a therapeutic relationship and I would recommend ultimately if this continues changing him to a different provider if not a different clinic.   I spent greater than 40 minutes  with the patient including greater than 50% of time in face to face counsel of the patient during his/her HIV is  coronary disease, Bell/s palay  and in coordination of his/her care.

## 2016-12-06 ENCOUNTER — Other Ambulatory Visit: Payer: Self-pay | Admitting: Infectious Disease

## 2017-01-01 ENCOUNTER — Telehealth: Payer: Self-pay

## 2017-01-01 NOTE — Telephone Encounter (Signed)
Voice mail: Please call.    Returning call.  Laurell Josephsammy K Amelia Macken, RN

## 2017-01-24 ENCOUNTER — Other Ambulatory Visit: Payer: Self-pay | Admitting: Infectious Disease

## 2017-02-11 ENCOUNTER — Ambulatory Visit: Payer: BLUE CROSS/BLUE SHIELD

## 2017-02-21 ENCOUNTER — Encounter: Payer: Self-pay | Admitting: Infectious Disease

## 2017-04-04 ENCOUNTER — Other Ambulatory Visit: Payer: Self-pay | Admitting: Infectious Disease

## 2017-04-05 ENCOUNTER — Other Ambulatory Visit: Payer: Self-pay | Admitting: Infectious Disease

## 2017-04-15 ENCOUNTER — Ambulatory Visit: Payer: BLUE CROSS/BLUE SHIELD | Admitting: Infectious Disease

## 2017-06-26 ENCOUNTER — Other Ambulatory Visit: Payer: Self-pay | Admitting: Infectious Disease

## 2017-07-25 ENCOUNTER — Other Ambulatory Visit: Payer: Self-pay | Admitting: Infectious Disease

## 2017-07-25 DIAGNOSIS — B20 Human immunodeficiency virus [HIV] disease: Secondary | ICD-10-CM

## 2017-07-29 ENCOUNTER — Other Ambulatory Visit: Payer: Self-pay | Admitting: Infectious Disease

## 2017-07-30 NOTE — Telephone Encounter (Signed)
error 

## 2017-08-26 ENCOUNTER — Other Ambulatory Visit: Payer: Self-pay | Admitting: Infectious Disease

## 2017-08-26 DIAGNOSIS — B20 Human immunodeficiency virus [HIV] disease: Secondary | ICD-10-CM

## 2017-08-26 DIAGNOSIS — K219 Gastro-esophageal reflux disease without esophagitis: Secondary | ICD-10-CM

## 2017-09-04 ENCOUNTER — Other Ambulatory Visit: Payer: Self-pay | Admitting: Infectious Disease

## 2017-09-06 ENCOUNTER — Telehealth: Payer: Self-pay | Admitting: *Deleted

## 2017-09-06 NOTE — Telephone Encounter (Signed)
Call from pharmacy, [facility] [phone] with question on gabapentin. Patient has been receiving 800 mg three times daily and 400 mg three times daily. Is this the correct dose? Patient stated this is how he has been taking it.

## 2017-09-06 NOTE — Telephone Encounter (Signed)
yes

## 2017-09-06 NOTE — Telephone Encounter (Signed)
He has been taking dose of  TID?

## 2017-09-06 NOTE — Telephone Encounter (Signed)
He can continue on this but this is the MAX dose

## 2017-09-09 NOTE — Telephone Encounter (Signed)
Pharmacist notified.

## 2017-09-19 ENCOUNTER — Ambulatory Visit: Payer: BLUE CROSS/BLUE SHIELD | Admitting: Infectious Disease

## 2017-09-24 ENCOUNTER — Encounter: Payer: Self-pay | Admitting: Infectious Disease

## 2017-09-25 ENCOUNTER — Ambulatory Visit: Payer: BLUE CROSS/BLUE SHIELD | Admitting: Infectious Disease

## 2017-09-26 ENCOUNTER — Other Ambulatory Visit: Payer: Self-pay | Admitting: Infectious Disease

## 2017-10-17 ENCOUNTER — Encounter: Payer: Self-pay | Admitting: Infectious Disease

## 2017-10-17 ENCOUNTER — Ambulatory Visit (INDEPENDENT_AMBULATORY_CARE_PROVIDER_SITE_OTHER): Payer: Self-pay | Admitting: Infectious Disease

## 2017-10-17 ENCOUNTER — Other Ambulatory Visit (HOSPITAL_COMMUNITY): Admission: RE | Admit: 2017-10-17 | Payer: Self-pay | Source: Ambulatory Visit | Admitting: Infectious Disease

## 2017-10-17 VITALS — BP 113/80 | HR 80 | Temp 98.0°F | Ht 72.0 in | Wt 215.0 lb

## 2017-10-17 DIAGNOSIS — I2510 Atherosclerotic heart disease of native coronary artery without angina pectoris: Secondary | ICD-10-CM

## 2017-10-17 DIAGNOSIS — Z79899 Other long term (current) drug therapy: Secondary | ICD-10-CM

## 2017-10-17 DIAGNOSIS — B20 Human immunodeficiency virus [HIV] disease: Secondary | ICD-10-CM

## 2017-10-17 DIAGNOSIS — G4700 Insomnia, unspecified: Secondary | ICD-10-CM

## 2017-10-17 DIAGNOSIS — Z23 Encounter for immunization: Secondary | ICD-10-CM

## 2017-10-17 DIAGNOSIS — Z113 Encounter for screening for infections with a predominantly sexual mode of transmission: Secondary | ICD-10-CM

## 2017-10-17 MED ORDER — EMTRICITABINE-TENOFOVIR AF 200-25 MG PO TABS: 1.0000 | ORAL_TABLET | Freq: Every day | ORAL | 11 refills | Status: DC

## 2017-10-17 MED ORDER — DOLUTEGRAVIR SODIUM 50 MG PO TABS: 50.0000 mg | ORAL_TABLET | Freq: Every day | ORAL | 11 refills | Status: DC

## 2017-10-17 NOTE — Progress Notes (Signed)
Chief complaint: Douglas Salazar is here for follouwup for his HIV  Subjective:    Patient ID: Douglas Salazar, male    DOB: 14-Oct-1971, 46 y.o.   MRN: [id number]  HPI  46 year old at times Trans gender woman but more recently appears to be presenting as cis gender male.   He WAS seen in 2017 but we have had no labs since 2016. He is happy on Tivicay and Descovy though at times has some insomnia when taking it at night.     Lab Results  Component Value Date   HIV1RNAQUANT 57 (H) 10/10/2015   HIV1RNAQUANT <20 11/22/2014   HIV1RNAQUANT 65 (H) 04/13/2014       Lab Results  Component Value Date   CD4TABS 1,920 10/10/2015   CD4TABS 2,090 11/22/2014   CD4TABS 1,630 04/13/2014   Past Medical History:  Diagnosis Date  . Adjustment disorder with mixed anxiety and depressed mood 10/10/2015  . AIDS (HCC)   . Anticipatory grieving 10/10/2015  . Anxiety   . Bell's palsy 12/05/2016  . CAD (coronary artery disease)   . Depression   . Dysphagia   . GERD (gastroesophageal reflux disease)   . 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 HTN (hypertension) 12/05/2016  . Hyperlipidemia   . Hypertension   . Myocardial infarction   . Neuromuscular disorder (HCC)   . Substance abuse     Past Surgical History:  Procedure Laterality Date  . ENDOVASCULAR STENT INSERTION      Family History  Problem Relation Age of Onset  . Hypertension Mother   . Heart disease Father   . Hypertension Father   . Hearing loss Paternal Grandfather   . Cancer Neg Hx   . Diabetes Neg Hx   . Stroke Neg Hx       Social History   Social History  . Marital status: Single    Spouse name: N/A  . Number of children: N/A  . Years of education: N/A   Social History Main Topics  . Smoking status: Former Smoker    Quit date: 05/31/2012  . Smokeless tobacco: Never Used     Comment: pt no longer smokes  . Alcohol use No  . Drug use: No  . Sexual activity: Not Currently     Comment: pt. declined condoms    Other Topics Concern  . Not on file   Social History Narrative  . No narrative on file    Allergies  Allergen Reactions  . Naproxen   . Sulfonamide Derivatives      Current Outpatient Prescriptions:  .  atorvastatin (LIPITOR) 20 MG tablet, TAKE 1 TABLET BY MOUTH EVERY DAY, Disp: 30 tablet, Rfl: 1 .  DESCOVY 200-25 MG tablet, TAKE 1 TABLET BY MOUTH DAILY., Disp: 30 tablet, Rfl: 1 .  escitalopram (LEXAPRO) 10 MG tablet, Take 1 tablet (10 mg total) by mouth daily., Disp: 30 tablet, Rfl: 11 .  gabapentin (NEURONTIN) 400 MG capsule, TAKE 1 CAPSULE BY MOUTH THREE TIMES DAILY, Disp: 90 capsule, Rfl: 1 .  gabapentin (NEURONTIN) 800 MG tablet, TAKE 1 TABLET(800 MG) BY MOUTH THREE TIMES DAILY, Disp: 90 tablet, Rfl: 0 .  lisinopril (PRINIVIL,ZESTRIL) 20 MG tablet, Take 1 tablet (20 mg total) by mouth daily., Disp: 30 tablet, Rfl: 11 .  nitroGLYCERIN (NITROSTAT) 0.4 MG SL tablet, Place 1 tablet (0.4 mg total) under the tongue every 5 (five) minutes as needed for chest pain., Disp: 10 tablet, Rfl: 0 .  omeprazole (PRILOSEC) 20 MG capsule,  TAKE ONE CAPSULE BY MOUTH EVERY DAY(12 HOURS APART FROM NORVIR, REYATAZ, AND TRUVADA), Disp: 30 capsule, Rfl: 1 .  oxyCODONE-acetaminophen (PERCOCET) 10-325 MG per tablet, Take 1 tablet by mouth 2 (two) times daily., Disp: 60 tablet, Rfl: 0 .  TIVICAY 50 MG tablet, TAKE 1 TABLET BY MOUTH DAILY., Disp: 30 tablet, Rfl: 1   Review of Systems  Constitutional: Negative for appetite change, diaphoresis and unexpected weight change.  HENT: Negative for congestion, sinus pressure, sneezing and trouble swallowing.   Eyes: Negative for photophobia and visual disturbance.  Respiratory: Negative for chest tightness and stridor.   Cardiovascular: Negative for palpitations and leg swelling.  Gastrointestinal: Negative for abdominal distention, abdominal pain, anal bleeding, blood in stool, constipation, diarrhea, nausea and vomiting.  Genitourinary: Negative for  difficulty urinating, dysuria, flank pain and hematuria.  Musculoskeletal: Negative for arthralgias, back pain and joint swelling.  Skin: Negative for color change, pallor and wound.  Neurological: Positive for numbness. Negative for dizziness, tremors, weakness and light-headedness.  Hematological: Negative for adenopathy. Does not bruise/bleed easily.  Psychiatric/Behavioral: Positive for dysphoric mood and sleep disturbance. Negative for agitation, behavioral problems, confusion and suicidal ideas.       Objective:   Physical Exam  Constitutional: He is oriented to person, place, and time. He appears well-developed and well-nourished. No distress.  HENT:  Head: Normocephalic and atraumatic.  Mouth/Throat: Oropharynx is clear and moist. No oropharyngeal exudate, posterior oropharyngeal edema, posterior oropharyngeal erythema or tonsillar abscesses.  Eyes: Pupils are equal, round, and reactive to light. Conjunctivae and EOM are normal.  Neck: Normal range of motion. Neck supple.  Cardiovascular: Normal rate and regular rhythm.   Pulmonary/Chest: Effort normal. No respiratory distress.  Abdominal: He exhibits no distension.  Musculoskeletal: He exhibits no edema or tenderness.  Neurological: He is alert and oriented to person, place, and time. He has normal reflexes. Coordination normal.  Has a left-sided facial droop consistent with Bell's palsy  Skin: Skin is warm and dry. He is not diaphoretic. No erythema. No pallor.  Psychiatric: His behavior is normal. Judgment and thought content normal. His affect is inappropriate. His affect is not labile.          Assessment & Plan:  HIV: Continue TIVICAY and  Descovy, Check labs today.  Coronary artery disease: [place]  is supposed to be on a statin and is followed by Dr. Sharyn Lull  Insomnia: try taking the INSTI regimen in the am rather than pm

## 2017-10-18 LAB — LIPID PANEL
CHOLESTEROL: 167 mg/dL (ref <200)
HDL: 33 mg/dL — ABNORMAL LOW (ref >40)
LDL CHOLESTEROL (CALC): 98 mg/dL
Non-HDL Cholesterol (Calc): 134 mg/dL (calc) — ABNORMAL HIGH (ref <130)
TRIGLYCERIDES: 236 mg/dL — AB (ref <150)
Total CHOL/HDL Ratio: 5.1 (calc) — ABNORMAL HIGH (ref <5.0)

## 2017-10-18 LAB — COMPLETE METABOLIC PANEL WITH GFR
AG RATIO: 1.4 (calc) (ref 1.0–2.5)
ALBUMIN MSPROF: 4.1 g/dL (ref 3.6–5.1)
ALT: 17 U/L (ref 9–46)
AST: 16 U/L (ref 10–40)
Alkaline phosphatase (APISO): 106 U/L (ref 40–115)
BUN: 10 mg/dL (ref 7–25)
CALCIUM: 9.3 mg/dL (ref 8.6–10.3)
CO2: 24 mmol/L (ref 20–32)
CREATININE: 0.96 mg/dL (ref 0.60–1.35)
Chloride: 106 mmol/L (ref 98–110)
GFR, EST NON AFRICAN AMERICAN: 94 mL/min/{1.73_m2} (ref >=60)
GFR, Est African American: 109 mL/min/{1.73_m2} (ref >=60)
GLOBULIN: 3 g/dL (ref 1.9–3.7)
Glucose, Bld: 92 mg/dL (ref 65–99)
POTASSIUM: 4.5 mmol/L (ref 3.5–5.3)
SODIUM: 138 mmol/L (ref 135–146)
Total Bilirubin: 0.4 mg/dL (ref 0.2–1.2)
Total Protein: 7.1 g/dL (ref 6.1–8.1)

## 2017-10-18 LAB — CBC WITH DIFFERENTIAL/PLATELET
BASOS ABS: 40 {cells}/uL (ref 0–200)
Basophils Relative: 0.3 %
Eosinophils Absolute: 67 cells/uL (ref 15–500)
Eosinophils Relative: 0.5 %
HCT: 42.2 % (ref 38.5–50.0)
Hemoglobin: 14.4 g/dL (ref 13.2–17.1)
Lymphs Abs: 5759 cells/uL — ABNORMAL HIGH (ref 850–3900)
MCH: 29.4 pg (ref 27.0–33.0)
MCHC: 34.1 g/dL (ref 32.0–36.0)
MCV: 86.3 fL (ref 80.0–100.0)
MONOS PCT: 5.3 %
MPV: 10.6 fL (ref 7.5–12.5)
NEUTROS PCT: 50.6 %
Neutro Abs: 6730 cells/uL (ref 1500–7800)
PLATELETS: 325 10*3/uL (ref 140–400)
RBC: 4.89 10*6/uL (ref 4.20–5.80)
RDW: 13.4 % (ref 11.0–15.0)
TOTAL LYMPHOCYTE: 43.3 %
WBC: 13.3 10*3/uL — ABNORMAL HIGH (ref 3.8–10.8)
WBCMIX: 705 {cells}/uL (ref 200–950)

## 2017-10-18 LAB — T-HELPER CELL (CD4) - (RCID CLINIC ONLY)
CD4 T CELL HELPER: 36 % (ref 33–55)
CD4 T Cell Abs: 2100 /uL (ref 400–2700)

## 2017-10-18 LAB — URINE CYTOLOGY ANCILLARY ONLY
CHLAMYDIA, DNA PROBE: NEGATIVE
NEISSERIA GONORRHEA: NEGATIVE

## 2017-10-18 LAB — RPR: RPR Ser Ql: NONREACTIVE

## 2017-10-22 LAB — HIV RNA, RTPCR W/R GT (RTI, PI,INT)
HIV 1 RNA Quant: <20 copies/mL
HIV-1 RNA QUANT, LOG: DETECTED {Log_copies}/mL

## 2017-10-28 ENCOUNTER — Other Ambulatory Visit: Payer: Self-pay | Admitting: Infectious Disease

## 2017-10-28 ENCOUNTER — Other Ambulatory Visit: Payer: Self-pay | Admitting: *Deleted

## 2017-10-28 DIAGNOSIS — B20 Human immunodeficiency virus [HIV] disease: Secondary | ICD-10-CM

## 2017-10-28 DIAGNOSIS — K219 Gastro-esophageal reflux disease without esophagitis: Secondary | ICD-10-CM

## 2017-10-28 MED ORDER — GABAPENTIN 400 MG PO CAPS: 400.0000 mg | ORAL_CAPSULE | Freq: Three times a day (TID) | ORAL | 5 refills | Status: DC

## 2017-10-28 MED ORDER — OMEPRAZOLE 20 MG PO CPDR: DELAYED_RELEASE_CAPSULE | ORAL | 5 refills | Status: DC

## 2017-10-28 MED ORDER — DOLUTEGRAVIR SODIUM 50 MG PO TABS: 50.0000 mg | ORAL_TABLET | Freq: Every day | ORAL | 11 refills | Status: DC

## 2017-10-28 MED ORDER — EMTRICITABINE-TENOFOVIR AF 200-25 MG PO TABS: 1.0000 | ORAL_TABLET | Freq: Every day | ORAL | 11 refills | Status: DC

## 2017-10-28 MED ORDER — ATORVASTATIN CALCIUM 20 MG PO TABS: 20.0000 mg | ORAL_TABLET | Freq: Every day | ORAL | 11 refills | Status: DC

## 2017-10-28 MED ORDER — GABAPENTIN 800 MG PO TABS: ORAL_TABLET | ORAL | 5 refills | Status: DC

## 2017-11-05 ENCOUNTER — Other Ambulatory Visit: Payer: Self-pay | Admitting: *Deleted

## 2017-11-05 ENCOUNTER — Telehealth: Payer: Self-pay | Admitting: *Deleted

## 2017-11-05 DIAGNOSIS — B20 Human immunodeficiency virus [HIV] disease: Secondary | ICD-10-CM

## 2017-11-05 DIAGNOSIS — K219 Gastro-esophageal reflux disease without esophagitis: Secondary | ICD-10-CM

## 2017-11-05 MED ORDER — GABAPENTIN 400 MG PO CAPS: 400.0000 mg | ORAL_CAPSULE | Freq: Three times a day (TID) | ORAL | 5 refills | Status: DC

## 2017-11-05 MED ORDER — OMEPRAZOLE 20 MG PO CPDR: DELAYED_RELEASE_CAPSULE | ORAL | 5 refills | Status: DC

## 2017-11-05 MED ORDER — DOLUTEGRAVIR SODIUM 50 MG PO TABS: 50.0000 mg | ORAL_TABLET | Freq: Every day | ORAL | 11 refills | Status: DC

## 2017-11-05 MED ORDER — EMTRICITABINE-TENOFOVIR AF 200-25 MG PO TABS: 1.0000 | ORAL_TABLET | Freq: Every day | ORAL | 11 refills | Status: DC

## 2017-11-05 MED ORDER — ATORVASTATIN CALCIUM 20 MG PO TABS: 20.0000 mg | ORAL_TABLET | Freq: Every day | ORAL | 11 refills | Status: DC

## 2017-11-05 MED ORDER — GABAPENTIN 800 MG PO TABS: ORAL_TABLET | ORAL | 5 refills | Status: DC

## 2017-11-05 NOTE — Telephone Encounter (Signed)
If Douglas Salazar is needing his nitroglycerin frequently THEN HIS CARDIOLOGIST NEEDS TO KNOW ABOUT THIS AND HE SHOULD SEND THE SCRIPT TO PHARMACY. End of discussion

## 2017-11-05 NOTE — Telephone Encounter (Signed)
Patient called requesting an Rx for nitroglycerin. Advised patient to ask for this from his cardiologist and he said he needs [employer] to pay for it. Explained it does not matter what provider prescribes it to get it paid through [employer]. He wanted me to ask Dr. Daiva EvesVan Dam to fill this, please advise.

## 2017-11-06 NOTE — Telephone Encounter (Signed)
Patient informed. 

## 2017-11-06 NOTE — Telephone Encounter (Signed)
Thanks Jackie 

## 2017-12-04 ENCOUNTER — Other Ambulatory Visit: Payer: Self-pay | Admitting: Infectious Disease

## 2017-12-04 DIAGNOSIS — B20 Human immunodeficiency virus [HIV] disease: Secondary | ICD-10-CM

## 2018-05-02 ENCOUNTER — Ambulatory Visit: Payer: BLUE CROSS/BLUE SHIELD

## 2018-05-12 ENCOUNTER — Encounter: Payer: Self-pay | Admitting: Infectious Disease

## 2018-05-20 DIAGNOSIS — R079 Chest pain, unspecified: Secondary | ICD-10-CM | POA: Insufficient documentation

## 2018-05-21 ENCOUNTER — Other Ambulatory Visit: Payer: Self-pay | Admitting: Infectious Disease

## 2018-05-21 DIAGNOSIS — B20 Human immunodeficiency virus [HIV] disease: Secondary | ICD-10-CM

## 2018-05-21 DIAGNOSIS — K219 Gastro-esophageal reflux disease without esophagitis: Secondary | ICD-10-CM

## 2018-07-17 ENCOUNTER — Other Ambulatory Visit: Payer: Self-pay | Admitting: Infectious Disease

## 2018-07-17 DIAGNOSIS — B20 Human immunodeficiency virus [HIV] disease: Secondary | ICD-10-CM

## 2018-08-30 DIAGNOSIS — S161XXA Strain of muscle, fascia and tendon at neck level, initial encounter: Secondary | ICD-10-CM | POA: Insufficient documentation

## 2018-08-30 DIAGNOSIS — S060X0A Concussion without loss of consciousness, initial encounter: Secondary | ICD-10-CM | POA: Insufficient documentation

## 2018-10-20 ENCOUNTER — Ambulatory Visit: Payer: Self-pay

## 2018-10-21 ENCOUNTER — Encounter: Payer: Self-pay | Admitting: Infectious Disease

## 2018-11-24 ENCOUNTER — Other Ambulatory Visit: Payer: Self-pay | Admitting: Infectious Disease

## 2018-11-24 DIAGNOSIS — B20 Human immunodeficiency virus [HIV] disease: Secondary | ICD-10-CM

## 2018-12-25 ENCOUNTER — Other Ambulatory Visit: Payer: Self-pay | Admitting: Infectious Disease

## 2018-12-25 DIAGNOSIS — B20 Human immunodeficiency virus [HIV] disease: Secondary | ICD-10-CM

## 2018-12-25 DIAGNOSIS — K219 Gastro-esophageal reflux disease without esophagitis: Secondary | ICD-10-CM

## 2019-01-27 ENCOUNTER — Encounter: Payer: Self-pay | Admitting: Infectious Disease

## 2019-01-27 ENCOUNTER — Ambulatory Visit: Payer: Self-pay

## 2019-01-27 ENCOUNTER — Ambulatory Visit (INDEPENDENT_AMBULATORY_CARE_PROVIDER_SITE_OTHER): Payer: Self-pay | Admitting: Infectious Disease

## 2019-01-27 ENCOUNTER — Other Ambulatory Visit: Payer: Self-pay

## 2019-01-27 VITALS — BP 145/84 | HR 84 | Temp 98.3°F | Wt 221.0 lb

## 2019-01-27 DIAGNOSIS — B20 Human immunodeficiency virus [HIV] disease: Secondary | ICD-10-CM

## 2019-01-27 DIAGNOSIS — I10 Essential (primary) hypertension: Secondary | ICD-10-CM

## 2019-01-27 DIAGNOSIS — I2510 Atherosclerotic heart disease of native coronary artery without angina pectoris: Secondary | ICD-10-CM

## 2019-01-27 DIAGNOSIS — Z23 Encounter for immunization: Secondary | ICD-10-CM

## 2019-01-27 DIAGNOSIS — G63 Polyneuropathy in diseases classified elsewhere: Secondary | ICD-10-CM

## 2019-01-27 MED ORDER — GABAPENTIN 400 MG PO CAPS: ORAL_CAPSULE | ORAL | 5 refills | Status: DC

## 2019-01-27 MED ORDER — BICTEGRAVIR-EMTRICITAB-TENOFOV 50-200-25 MG PO TABS: 1.0000 | ORAL_TABLET | Freq: Every day | ORAL | 11 refills | Status: DC

## 2019-01-27 MED ORDER — GABAPENTIN 800 MG PO TABS: 800.0000 mg | ORAL_TABLET | Freq: Three times a day (TID) | ORAL | 5 refills | Status: DC

## 2019-01-27 NOTE — Progress Notes (Signed)
Chief complaint: Douglas Salazar is here for follouwup for his HIV  Subjective:    Patient ID: Douglas Salazar, male    DOB: 15-Oct-1971, 48 y.o.   MRN: [id number]  HPI  48 year old previously Trans gender woman but more recently appears to be presenting as cis gender male.   He has not had labs since 2018 but renewed his HMAP and liked Tivicay and Descovy but interested in "that new one pill once a day"      Past Medical History:  Diagnosis Date  . Adjustment disorder with mixed anxiety and depressed mood 10/10/2015  . AIDS (HCC)   . Anticipatory grieving 10/10/2015  . Anxiety   . Bell's palsy 12/05/2016  . CAD (coronary artery disease)   . Depression   . Dysphagia   . GERD (gastroesophageal reflux disease)   . 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 HTN (hypertension) 12/05/2016  . Hyperlipidemia   . Hypertension   . Myocardial infarction (HCC)   . Neuromuscular disorder (HCC)   . Substance abuse [facility])     Past Surgical History:  Procedure Laterality Date  . ENDOVASCULAR STENT INSERTION      Family History  Problem Relation Age of Onset  . Hypertension Mother   . Heart disease Father   . Hypertension Father   . Hearing loss Paternal Grandfather   . Cancer Neg Hx   . Diabetes Neg Hx   . Stroke Neg Hx       Social History   Socioeconomic History  . Marital status: Single    Spouse name: Not on file  . Number of children: Not on file  . Years of education: Not on file  . Highest education level: Not on file  Occupational History  . Not on file  Social Needs  . Financial resource strain: Not on file  . Food insecurity:    Worry: Not on file    Inability: Not on file  . Transportation needs:    Medical: Not on file    Non-medical: Not on file  Tobacco Use  . Smoking status: Former Smoker    Last attempt to quit: 05/31/2012    Years since quitting: 6.6  . Smokeless tobacco: Never Used  . Tobacco comment: pt no longer smokes  Substance and Sexual  Activity  . Alcohol use: No    Alcohol/week: 0.0 standard drinks  . Drug use: No  . Sexual activity: Not Currently    Comment: pt. declined condoms  Lifestyle  . Physical activity:    Days per week: Not on file    Minutes per session: Not on file  . Stress: Not on file  Relationships  . Social connections:    Talks on phone: Not on file    Gets together: Not on file    Attends religious service: Not on file    Active member of club or organization: Not on file    Attends meetings of clubs or organizations: Not on file    Relationship status: Not on file  Other Topics Concern  . Not on file  Social History Narrative  . Not on file    Allergies  Allergen Reactions  . Naproxen   . Sulfonamide Derivatives      Current Outpatient Medications:  .  atorvastatin (LIPITOR) 20 MG tablet, TAKE 1 TABLET(20 MG) BY MOUTH DAILY, Disp: 30 tablet, Rfl: 5 .  DESCOVY 200-25 MG tablet, TAKE 1 TABLET BY MOUTH DAILY, Disp: 30 tablet, Rfl: 5 .  escitalopram (LEXAPRO) 10 MG tablet, Take 1 tablet (10 mg total) by mouth daily., Disp: 30 tablet, Rfl: 11 .  lisinopril (PRINIVIL,ZESTRIL) 20 MG tablet, TAKE 1 TABLET(20 MG) BY MOUTH DAILY, Disp: 30 tablet, Rfl: 5 .  nitroGLYCERIN (NITROSTAT) 0.4 MG SL tablet, Place 1 tablet (0.4 mg total) under the tongue every 5 (five) minutes as needed for chest pain., Disp: 10 tablet, Rfl: 0 .  omeprazole (PRILOSEC) 20 MG capsule, TAKE 1 CAPSULE BY MOUTH EVERY DAY(12 HOURS APART FROM NORVIR, REYATAZ, AND TRUVADA)., Disp: 30 capsule, Rfl: 5 .  oxyCODONE-acetaminophen (PERCOCET) 10-325 MG per tablet, Take 1 tablet by mouth 2 (two) times daily., Disp: 60 tablet, Rfl: 0 .  TIVICAY 50 MG tablet, TAKE 1 TABLET(50 MG) BY MOUTH DAILY, Disp: 30 tablet, Rfl: 5 .  gabapentin (NEURONTIN) 400 MG capsule, TAKE 1 CAPSULE(400 MG) BY MOUTH THREE TIMES DAILY (Patient not taking: Reported on 01/27/2019), Disp: 90 capsule, Rfl: 5 .  gabapentin (NEURONTIN) 800 MG tablet, TAKE 1 TABLET(800  MG) BY MOUTH THREE TIMES DAILY (Patient not taking: Reported on 01/27/2019), Disp: 90 tablet, Rfl: 5   Review of Systems  Constitutional: Negative for appetite change, diaphoresis and unexpected weight change.  HENT: Negative for congestion, sinus pressure, sneezing and trouble swallowing.   Eyes: Negative for photophobia and visual disturbance.  Respiratory: Negative for chest tightness and stridor.   Cardiovascular: Negative for palpitations and leg swelling.  Gastrointestinal: Negative for abdominal distention, abdominal pain, anal bleeding, blood in stool, constipation, diarrhea, nausea and vomiting.  Genitourinary: Negative for difficulty urinating, dysuria, flank pain and hematuria.  Musculoskeletal: Negative for arthralgias, back pain and joint swelling.  Skin: Negative for color change, pallor and wound.  Neurological: Positive for numbness. Negative for dizziness, tremors, weakness and light-headedness.  Hematological: Negative for adenopathy. Does not bruise/bleed easily.  Psychiatric/Behavioral: Negative for agitation, behavioral problems, confusion, dysphoric mood, sleep disturbance and suicidal ideas.       Objective:   Physical Exam  Constitutional: He is oriented to person, place, and time. He appears well-developed and well-nourished. No distress.  HENT:  Head: Normocephalic and atraumatic.  Mouth/Throat: Oropharynx is clear and moist. No oropharyngeal exudate, posterior oropharyngeal edema, posterior oropharyngeal erythema or tonsillar abscesses.  Eyes: Pupils are equal, round, and reactive to light. Conjunctivae and EOM are normal.  Neck: Normal range of motion. Neck supple.  Cardiovascular: Normal rate and regular rhythm.  Pulmonary/Chest: Effort normal. No respiratory distress.  Abdominal: He exhibits no distension.  Musculoskeletal:        General: No tenderness or edema.  Neurological: He is alert and oriented to person, place, and time. He has normal reflexes.  Coordination normal.  Skin: Skin is warm and dry. He is not diaphoretic. No erythema. No pallor.  Psychiatric: He has a normal mood and affect. His speech is normal and behavior is normal. Judgment and thought content normal. His affect is not labile and not inappropriate. Cognition and memory are normal.          Assessment & Plan:  HIV: Switch to Biktarvy get baseline labs today and then repeat them in 2 months vaccinate for flu meningococcus and pneumomoa wotj {revnar  Coronary artery disease: [place]  is supposed to be on a statin and is followed by Dr. Sharyn Lull   HIV neuropathy: filled Gabapentin

## 2019-01-27 NOTE — Addendum Note (Signed)
Addended by: Lorenso Courier on: 01/27/2019 02:20 PM   Modules accepted: Orders

## 2019-01-28 ENCOUNTER — Encounter: Payer: Self-pay | Admitting: Infectious Disease

## 2019-01-28 LAB — T-HELPER CELL (CD4) - (RCID CLINIC ONLY)
CD4 % Helper T Cell: 35 % (ref 33–55)
CD4 T Cell Abs: 1600 /uL (ref 400–2700)

## 2019-01-28 LAB — URINE CYTOLOGY ANCILLARY ONLY
Chlamydia: NEGATIVE
Neisseria Gonorrhea: NEGATIVE

## 2019-01-29 LAB — CBC WITH DIFFERENTIAL/PLATELET
Absolute Monocytes: 1155 cells/uL — ABNORMAL HIGH (ref 200–950)
Basophils Absolute: 65 cells/uL (ref 0–200)
Basophils Relative: 0.3 %
Eosinophils Absolute: 0 cells/uL — ABNORMAL LOW (ref 15–500)
Eosinophils Relative: 0 %
HCT: 44.1 % (ref 38.5–50.0)
Hemoglobin: 14.7 g/dL (ref 13.2–17.1)
Lymphs Abs: 4534 cells/uL — ABNORMAL HIGH (ref 850–3900)
MCH: 29.1 pg (ref 27.0–33.0)
MCHC: 33.3 g/dL (ref 32.0–36.0)
MCV: 87.2 fL (ref 80.0–100.0)
MONOS PCT: 5.3 %
MPV: 11 fL (ref 7.5–12.5)
Neutro Abs: 16045 cells/uL — ABNORMAL HIGH (ref 1500–7800)
Neutrophils Relative %: 73.6 %
Platelets: 329 10*3/uL (ref 140–400)
RBC: 5.06 10*6/uL (ref 4.20–5.80)
RDW: 13.3 % (ref 11.0–15.0)
Total Lymphocyte: 20.8 %
WBC: 21.8 10*3/uL — ABNORMAL HIGH (ref 3.8–10.8)

## 2019-01-29 LAB — COMPLETE METABOLIC PANEL WITH GFR
AG Ratio: 1.2 (calc) (ref 1.0–2.5)
ALT: 31 U/L (ref 9–46)
AST: 15 U/L (ref 10–40)
Albumin: 4.2 g/dL (ref 3.6–5.1)
Alkaline phosphatase (APISO): 111 U/L (ref 40–115)
BUN: 13 mg/dL (ref 7–25)
CO2: 25 mmol/L (ref 20–32)
Calcium: 9.5 mg/dL (ref 8.6–10.3)
Chloride: 104 mmol/L (ref 98–110)
Creat: 0.96 mg/dL (ref 0.60–1.35)
GFR, Est African American: 108 mL/min/{1.73_m2} (ref >=60)
GFR, Est Non African American: 93 mL/min/{1.73_m2} (ref >=60)
GLUCOSE: 120 mg/dL — AB (ref 65–99)
Globulin: 3.6 g/dL (calc) (ref 1.9–3.7)
Potassium: 4.4 mmol/L (ref 3.5–5.3)
Sodium: 141 mmol/L (ref 135–146)
Total Bilirubin: 0.4 mg/dL (ref 0.2–1.2)
Total Protein: 7.8 g/dL (ref 6.1–8.1)

## 2019-01-29 LAB — LIPID PANEL
Cholesterol: 214 mg/dL — ABNORMAL HIGH (ref <200)
HDL: 51 mg/dL (ref >40)
LDL Cholesterol (Calc): 124 mg/dL (calc) — ABNORMAL HIGH
Non-HDL Cholesterol (Calc): 163 mg/dL (calc) — ABNORMAL HIGH (ref <130)
Total CHOL/HDL Ratio: 4.2 (calc) (ref <5.0)
Triglycerides: 253 mg/dL — ABNORMAL HIGH (ref <150)

## 2019-01-29 LAB — HIV-1 RNA QUANT-NO REFLEX-BLD
HIV 1 RNA QUANT: 24 {copies}/mL — AB
HIV-1 RNA Quant, Log: 1.38 Log copies/mL — ABNORMAL HIGH

## 2019-01-29 LAB — RPR: RPR Ser Ql: NONREACTIVE

## 2019-01-30 ENCOUNTER — Telehealth: Payer: Self-pay

## 2019-01-30 NOTE — Telephone Encounter (Signed)
Attempted to call patient regarding lab results. Number listed in patient's chart is  inactive;unable to speak with patient/leave voicemail.  Lorenso Courier, [place]

## 2019-01-30 NOTE — Telephone Encounter (Signed)
-----   Message from Randall Hiss, MD sent at 01/30/2019  8:29 AM EST ----- Douglas Salazar WBC I s remarkably elevated at 21k. He had not complaints when I saw him. Perhaps he could come back next week for repeat CBC w DIFF?

## 2019-03-24 ENCOUNTER — Other Ambulatory Visit: Payer: Self-pay | Admitting: Infectious Disease

## 2019-03-24 DIAGNOSIS — B20 Human immunodeficiency virus [HIV] disease: Secondary | ICD-10-CM

## 2019-03-31 ENCOUNTER — Ambulatory Visit: Payer: Self-pay | Admitting: Infectious Disease

## 2019-05-27 ENCOUNTER — Other Ambulatory Visit: Payer: Self-pay | Admitting: Infectious Disease

## 2019-05-27 DIAGNOSIS — B20 Human immunodeficiency virus [HIV] disease: Secondary | ICD-10-CM

## 2019-05-27 NOTE — Telephone Encounter (Signed)
Primary care provider should be managing this medication.

## 2019-06-26 ENCOUNTER — Other Ambulatory Visit: Payer: Self-pay | Admitting: Infectious Disease

## 2019-06-26 DIAGNOSIS — B20 Human immunodeficiency virus [HIV] disease: Secondary | ICD-10-CM

## 2019-07-14 ENCOUNTER — Ambulatory Visit: Payer: Self-pay | Admitting: Infectious Disease

## 2019-07-14 ENCOUNTER — Ambulatory Visit: Payer: Self-pay

## 2019-08-26 ENCOUNTER — Other Ambulatory Visit: Payer: Self-pay | Admitting: Infectious Disease

## 2019-08-26 DIAGNOSIS — K219 Gastro-esophageal reflux disease without esophagitis: Secondary | ICD-10-CM

## 2019-09-15 DIAGNOSIS — S81839A Puncture wound without foreign body, unspecified lower leg, initial encounter: Secondary | ICD-10-CM | POA: Insufficient documentation

## 2019-09-24 ENCOUNTER — Other Ambulatory Visit: Payer: Self-pay | Admitting: Infectious Disease

## 2019-09-24 DIAGNOSIS — B20 Human immunodeficiency virus [HIV] disease: Secondary | ICD-10-CM

## 2019-11-02 ENCOUNTER — Encounter: Payer: Self-pay | Admitting: Infectious Disease

## 2020-01-05 ENCOUNTER — Other Ambulatory Visit: Payer: Self-pay

## 2020-01-05 ENCOUNTER — Encounter: Payer: Self-pay | Admitting: Infectious Disease

## 2020-01-05 DIAGNOSIS — I2510 Atherosclerotic heart disease of native coronary artery without angina pectoris: Secondary | ICD-10-CM

## 2020-01-05 DIAGNOSIS — G63 Polyneuropathy in diseases classified elsewhere: Secondary | ICD-10-CM

## 2020-01-05 DIAGNOSIS — B20 Human immunodeficiency virus [HIV] disease: Secondary | ICD-10-CM

## 2020-01-05 DIAGNOSIS — I10 Essential (primary) hypertension: Secondary | ICD-10-CM

## 2020-01-06 LAB — T-HELPER CELL (CD4) - (RCID CLINIC ONLY)
CD4 % Helper T Cell: 46 % (ref 33–65)
CD4 T Cell Abs: 2372 /uL — ABNORMAL HIGH (ref 400–1790)

## 2020-01-08 LAB — COMPLETE METABOLIC PANEL WITH GFR
AG Ratio: 1.3 (calc) (ref 1.0–2.5)
ALT: 12 U/L (ref 9–46)
AST: 12 U/L (ref 10–40)
Albumin: 3.8 g/dL (ref 3.6–5.1)
Alkaline phosphatase (APISO): 103 U/L (ref 36–130)
BUN: 11 mg/dL (ref 7–25)
CO2: 26 mmol/L (ref 20–32)
Calcium: 9.2 mg/dL (ref 8.6–10.3)
Chloride: 107 mmol/L (ref 98–110)
Creat: 0.95 mg/dL (ref 0.60–1.35)
GFR, Est African American: 108 mL/min/{1.73_m2} (ref >=60)
GFR, Est Non African American: 94 mL/min/{1.73_m2} (ref >=60)
Globulin: 3 g/dL (calc) (ref 1.9–3.7)
Glucose, Bld: 91 mg/dL (ref 65–99)
Potassium: 4.4 mmol/L (ref 3.5–5.3)
Sodium: 140 mmol/L (ref 135–146)
Total Bilirubin: 0.3 mg/dL (ref 0.2–1.2)
Total Protein: 6.8 g/dL (ref 6.1–8.1)

## 2020-01-08 LAB — CBC WITH DIFFERENTIAL/PLATELET
Absolute Monocytes: 770 cells/uL (ref 200–950)
Basophils Absolute: 32 cells/uL (ref 0–200)
Basophils Relative: 0.3 %
Eosinophils Absolute: 75 cells/uL (ref 15–500)
Eosinophils Relative: 0.7 %
HCT: 37.2 % — ABNORMAL LOW (ref 38.5–50.0)
Hemoglobin: 12.3 g/dL — ABNORMAL LOW (ref 13.2–17.1)
Lymphs Abs: 5050 cells/uL — ABNORMAL HIGH (ref 850–3900)
MCH: 27.1 pg (ref 27.0–33.0)
MCHC: 33.1 g/dL (ref 32.0–36.0)
MCV: 81.9 fL (ref 80.0–100.0)
MPV: 10.2 fL (ref 7.5–12.5)
Monocytes Relative: 7.2 %
Neutro Abs: 4772 cells/uL (ref 1500–7800)
Neutrophils Relative %: 44.6 %
Platelets: 343 10*3/uL (ref 140–400)
RBC: 4.54 10*6/uL (ref 4.20–5.80)
RDW: 15.9 % — ABNORMAL HIGH (ref 11.0–15.0)
Total Lymphocyte: 47.2 %
WBC: 10.7 10*3/uL (ref 3.8–10.8)

## 2020-01-08 LAB — RPR: RPR Ser Ql: NONREACTIVE

## 2020-01-08 LAB — HIV-1 RNA QUANT-NO REFLEX-BLD
HIV 1 RNA Quant: 50 copies/mL — ABNORMAL HIGH
HIV-1 RNA Quant, Log: 1.7 Log copies/mL — ABNORMAL HIGH

## 2020-01-29 ENCOUNTER — Other Ambulatory Visit: Payer: Self-pay | Admitting: Infectious Disease

## 2020-01-29 DIAGNOSIS — B20 Human immunodeficiency virus [HIV] disease: Secondary | ICD-10-CM

## 2020-02-23 ENCOUNTER — Telehealth: Payer: Self-pay

## 2020-02-23 NOTE — Telephone Encounter (Signed)
COVID-19 Pre-Screening Questions:02/23/20   Do you currently have a fever (>100 F), chills or unexplained body aches? NO   Are you currently experiencing new cough, shortness of breath, sore throat, runny nose?NO  .  Have you recently travelled outside the state of [place] in the last 14 days? NO  .  Have you been in contact with someone that is currently pending confirmation of Covid19 testing or has been confirmed to have the Covid19 virus?  NO   **If the patient answers NO to ALL questions -  advise the patient to please call the clinic before coming to the office should any symptoms develop.    

## 2020-02-24 ENCOUNTER — Encounter: Payer: Self-pay | Admitting: Infectious Disease

## 2020-03-01 ENCOUNTER — Other Ambulatory Visit: Payer: Self-pay | Admitting: Infectious Disease

## 2020-03-01 DIAGNOSIS — K219 Gastro-esophageal reflux disease without esophagitis: Secondary | ICD-10-CM

## 2020-03-30 ENCOUNTER — Ambulatory Visit (INDEPENDENT_AMBULATORY_CARE_PROVIDER_SITE_OTHER): Payer: Self-pay | Admitting: Infectious Disease

## 2020-03-30 ENCOUNTER — Encounter: Payer: Self-pay | Admitting: Infectious Disease

## 2020-03-30 ENCOUNTER — Other Ambulatory Visit: Payer: Self-pay

## 2020-03-30 VITALS — BP 118/80 | HR 93 | Temp 98.6°F | Wt 223.0 lb

## 2020-03-30 DIAGNOSIS — I10 Essential (primary) hypertension: Secondary | ICD-10-CM

## 2020-03-30 DIAGNOSIS — I2510 Atherosclerotic heart disease of native coronary artery without angina pectoris: Secondary | ICD-10-CM

## 2020-03-30 DIAGNOSIS — Z23 Encounter for immunization: Secondary | ICD-10-CM

## 2020-03-30 DIAGNOSIS — E785 Hyperlipidemia, unspecified: Secondary | ICD-10-CM

## 2020-03-30 DIAGNOSIS — B20 Human immunodeficiency virus [HIV] disease: Secondary | ICD-10-CM

## 2020-03-30 DIAGNOSIS — F17200 Nicotine dependence, unspecified, uncomplicated: Secondary | ICD-10-CM

## 2020-03-30 DIAGNOSIS — G63 Polyneuropathy in diseases classified elsewhere: Secondary | ICD-10-CM

## 2020-03-30 MED ORDER — BIKTARVY 50-200-25 MG PO TABS: 1.0000 | ORAL_TABLET | Freq: Every day | ORAL | 11 refills | Status: DC
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
## 2020-03-30 NOTE — Addendum Note (Signed)
Addended by: Mariea Clonts D on: 03/30/2020 11:51 AM   Modules accepted: Orders

## 2020-04-01 ENCOUNTER — Other Ambulatory Visit: Payer: Self-pay | Admitting: Infectious Disease

## 2020-04-01 DIAGNOSIS — B20 Human immunodeficiency virus [HIV] disease: Secondary | ICD-10-CM

## 2020-04-06 ENCOUNTER — Other Ambulatory Visit: Payer: Self-pay

## 2020-04-06 DIAGNOSIS — B20 Human immunodeficiency virus [HIV] disease: Secondary | ICD-10-CM

## 2020-04-06 MED ORDER — GABAPENTIN 800 MG PO TABS: 800.0000 mg | ORAL_TABLET | Freq: Three times a day (TID) | ORAL | 3 refills | Status: DC

## 2020-04-08 ENCOUNTER — Other Ambulatory Visit: Payer: Self-pay

## 2020-04-08 DIAGNOSIS — B20 Human immunodeficiency virus [HIV] disease: Secondary | ICD-10-CM

## 2020-04-08 MED ORDER — GABAPENTIN 800 MG PO TABS: 800.0000 mg | ORAL_TABLET | Freq: Three times a day (TID) | ORAL | 3 refills | Status: DC

## 2020-06-01 ENCOUNTER — Other Ambulatory Visit: Payer: Self-pay | Admitting: Infectious Disease

## 2020-06-01 DIAGNOSIS — B20 Human immunodeficiency virus [HIV] disease: Secondary | ICD-10-CM

## 2020-06-10 ENCOUNTER — Telehealth: Payer: Self-pay

## 2020-06-10 NOTE — Telephone Encounter (Signed)
Patient called office today requesting referral for pain clinic. Would like referral to [facility] in [place], [place].  P: [phone] F: [phone] Patient is scheduled to see Dr. Daiva Eves on 7/7. Lorenso Courier, [place]

## 2020-06-13 NOTE — Telephone Encounter (Signed)
Patient called to follow up on referral for pain management clinic.   Kamika Goodloe Loyola Mast, RN

## 2020-06-14 ENCOUNTER — Other Ambulatory Visit: Payer: Self-pay | Admitting: Infectious Disease

## 2020-06-14 DIAGNOSIS — G894 Chronic pain syndrome: Secondary | ICD-10-CM

## 2020-06-14 NOTE — Progress Notes (Signed)
See phone note

## 2020-06-14 NOTE — Telephone Encounter (Signed)
I put in order for referral. Claris Che the contact info is in this phone note

## 2020-06-30 ENCOUNTER — Other Ambulatory Visit: Payer: Self-pay | Admitting: Infectious Disease

## 2020-06-30 DIAGNOSIS — B20 Human immunodeficiency virus [HIV] disease: Secondary | ICD-10-CM

## 2020-07-06 ENCOUNTER — Telehealth (INDEPENDENT_AMBULATORY_CARE_PROVIDER_SITE_OTHER): Payer: Self-pay | Admitting: Infectious Disease

## 2020-07-06 ENCOUNTER — Other Ambulatory Visit: Payer: Self-pay

## 2020-07-06 DIAGNOSIS — B20 Human immunodeficiency virus [HIV] disease: Secondary | ICD-10-CM

## 2020-07-06 DIAGNOSIS — E782 Mixed hyperlipidemia: Secondary | ICD-10-CM

## 2020-07-06 DIAGNOSIS — I10 Essential (primary) hypertension: Secondary | ICD-10-CM

## 2020-07-06 NOTE — Progress Notes (Signed)
Virtual Visit via Telephone Note  I connected with Douglas Salazar on 07/06/20 at  4:15 PM EDT by telephone and verified that I am speaking with the correct person using two identifiers.  Location: Patient: Home Provider: [facility]   I discussed the limitations, risks, security and privacy concerns of performing an evaluation and management service by telephone and the availability of in person appointments. I also discussed with the patient that there may be a patient responsible charge related to this service. The patient expressed understanding and agreed to proceed.   History of Present Illness:  49  year old [place]/African American man living with HIV that has been suppressed on Biktarvy.  He sustained a femoral neck fracture as docuemnted in my note from March.  He tells me he has appt with [name] to renew HMAP but want to double check that this is the case. He claims he is taking his Biktarvy religiously.   12 point ROS as  Above and otherwise negative  Past Medical History:  Diagnosis Date  . Adjustment disorder with mixed anxiety and depressed mood 10/10/2015  . AIDS (HCC)   . Anticipatory grieving 10/10/2015  . Anxiety   . Bell's palsy 12/05/2016  . CAD (coronary artery disease)   . Depression   . Dysphagia   . GERD (gastroesophageal reflux disease)   . 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 HTN (hypertension) 12/05/2016  . Hyperlipidemia   . Hypertension   . Myocardial infarction (HCC)   . Neuromuscular disorder (HCC)   . Substance abuse [facility])     Past Surgical History:  Procedure Laterality Date  . ENDOVASCULAR STENT INSERTION      Family History  Problem Relation Age of Onset  . Hypertension Mother   . Heart disease Father   . Hypertension Father   . Hearing loss Paternal Grandfather   . Cancer Neg Hx   . Diabetes Neg Hx   . Stroke Neg Hx       Social History   Socioeconomic History  . Marital status: Single    Spouse name: Not on file  .  Number of children: Not on file  . Years of education: Not on file  . Highest education level: Not on file  Occupational History  . Not on file  Tobacco Use  . Smoking status: Former Smoker    Quit date: 05/31/2012    Years since quitting: 8.1  . Smokeless tobacco: Never Used  . Tobacco comment: pt no longer smokes  Substance and Sexual Activity  . Alcohol use: No    Alcohol/week: 0.0 standard drinks  . Drug use: No  . Sexual activity: Not Currently    Comment: pt. declined condoms  Other Topics Concern  . Not on file  Social History Narrative  . Not on file   Social Determinants of Health   Financial Resource Strain:   . Difficulty of Paying Living Expenses:   Food Insecurity:   . Worried About Programme researcher, broadcasting/film/video in the Last Year:   . Barista in the Last Year:   Transportation Needs:   . Freight forwarder (Medical):   Marland Kitchen Lack of Transportation (Non-Medical):   Physical Activity:   . Days of Exercise per Week:   . Minutes of Exercise per Session:   Stress:   . Feeling of Stress :   Social Connections:   . Frequency of Communication with Friends and Family:   . Frequency of Social Gatherings with  Friends and Family:   . Attends Religious Services:   . Active Member of Clubs or Organizations:   . Attends Banker Meetings:   Marland Kitchen Marital Status:     Allergies  Allergen Reactions  . Naproxen   . Sulfonamide Derivatives      Current Outpatient Medications:  .  atorvastatin (LIPITOR) 20 MG tablet, TAKE 1 TABLET(20MG ) BY MOUTH DAILY, Disp: 30 tablet, Rfl: 2 .  bictegravir-emtricitabine-tenofovir AF (BIKTARVY) 50-200-25 MG TABS tablet, Take 1 tablet by mouth daily., Disp: 30 tablet, Rfl: 11 .  escitalopram (LEXAPRO) 10 MG tablet, Take 1 tablet (10 mg total) by mouth daily., Disp: 30 tablet, Rfl: 11 .  gabapentin (NEURONTIN) 400 MG capsule, TAKE 1 CAPSULE BY MOUTH THREE TIMES DAILY, Disp: 90 capsule, Rfl: 0 .  gabapentin (NEURONTIN) 800 MG tablet,  Take 1 tablet (800 mg total) by mouth 3 (three) times daily., Disp: 90 tablet, Rfl: 3 .  lisinopril (ZESTRIL) 20 MG tablet, TAKE 1 TABLET(20 MG) BY MOUTH ONCE DAILY, Disp: 30 tablet, Rfl: 2 .  omeprazole (PRILOSEC) 20 MG capsule, TAKE 1 CAPSULE BY MOUTH EVERY DAY(12 HOURS APART FROM NORVIR, REYATAZ, AND TRUVADA)., Disp: 30 capsule, Rfl: 5 .  nitroGLYCERIN (NITROSTAT) 0.4 MG SL tablet, Place 1 tablet (0.4 mg total) under the tongue every 5 (five) minutes as needed for chest pain. (Patient not taking: Reported on 07/06/2020), Disp: 10 tablet, Rfl: 0   Observations/Objective:  Douglas Salazar seems to be doing well. He still doesn't want to get COVID vaccijne  Assessment and Plan:  HIV: seems well controlled and I believe he is adherent. Ensure HMAP renewal. RTC to see me and same day labs  In September  Need for COVID vaccination: He has done research he says on all of the "bad things" that are in COVID vaccines. I asked him to please get vaccinated but know I cannot force him to do so.  Follow Up Instructions:    I discussed the assessment and treatment plan with the patient. The patient was provided an opportunity to ask questions and all were answered. The patient agreed with the plan and demonstrated an understanding of the instructions.   The patient was advised to call back or seek an in-person evaluation if the symptoms worsen or if the condition fails to improve as anticipated.  I provided 9  minutes of non-face-to-face time during this encounter.   Acey Lav, MD

## 2020-07-11 ENCOUNTER — Ambulatory Visit: Payer: Medicaid Other | Admitting: Infectious Disease

## 2020-08-02 ENCOUNTER — Other Ambulatory Visit: Payer: Self-pay | Admitting: Infectious Disease

## 2020-08-02 DIAGNOSIS — B20 Human immunodeficiency virus [HIV] disease: Secondary | ICD-10-CM

## 2020-08-03 ENCOUNTER — Other Ambulatory Visit: Payer: Self-pay

## 2020-08-03 DIAGNOSIS — B20 Human immunodeficiency virus [HIV] disease: Secondary | ICD-10-CM

## 2020-08-03 MED ORDER — GABAPENTIN 400 MG PO CAPS: 400.0000 mg | ORAL_CAPSULE | Freq: Three times a day (TID) | ORAL | 0 refills | Status: DC

## 2020-08-05 ENCOUNTER — Other Ambulatory Visit: Payer: Self-pay | Admitting: Infectious Disease

## 2020-08-05 DIAGNOSIS — B20 Human immunodeficiency virus [HIV] disease: Secondary | ICD-10-CM

## 2020-08-24 DIAGNOSIS — Z9181 History of falling: Secondary | ICD-10-CM | POA: Insufficient documentation

## 2020-08-25 ENCOUNTER — Other Ambulatory Visit: Payer: Self-pay | Admitting: Infectious Disease

## 2020-08-25 DIAGNOSIS — B20 Human immunodeficiency virus [HIV] disease: Secondary | ICD-10-CM

## 2020-08-25 DIAGNOSIS — K219 Gastro-esophageal reflux disease without esophagitis: Secondary | ICD-10-CM

## 2020-08-30 ENCOUNTER — Encounter: Payer: Self-pay | Admitting: Infectious Disease

## 2020-09-12 ENCOUNTER — Telehealth: Payer: Self-pay

## 2020-09-12 NOTE — Telephone Encounter (Signed)
Received VM in triage from patient stating her wanted to convert his visit to video call. Attempted to call patient to follow up, no answer and unable to leave a VM at this time. Patient has not had labs since 01/20.  Will follow up with patient tomorrow.  Valarie Cones

## 2020-09-14 ENCOUNTER — Telehealth: Payer: Self-pay

## 2020-09-14 ENCOUNTER — Other Ambulatory Visit: Payer: Self-pay

## 2020-09-14 ENCOUNTER — Telehealth (INDEPENDENT_AMBULATORY_CARE_PROVIDER_SITE_OTHER): Payer: Self-pay | Admitting: Infectious Disease

## 2020-09-14 DIAGNOSIS — W3400XD Accidental discharge from unspecified firearms or gun, subsequent encounter: Secondary | ICD-10-CM

## 2020-09-14 DIAGNOSIS — G8929 Other chronic pain: Secondary | ICD-10-CM

## 2020-09-14 DIAGNOSIS — B20 Human immunodeficiency virus [HIV] disease: Secondary | ICD-10-CM

## 2020-09-14 DIAGNOSIS — G63 Polyneuropathy in diseases classified elsewhere: Secondary | ICD-10-CM

## 2020-09-14 DIAGNOSIS — S81839D Puncture wound without foreign body, unspecified lower leg, subsequent encounter: Secondary | ICD-10-CM

## 2020-09-14 NOTE — Telephone Encounter (Signed)
Patient called office today to reschedule appointment for a later date. Will need updated labs same day. Patient requesting referral for [facility]. Is requesting to see Dr. Lanae Crumbly.  Does not have a PCP listed.  Douglas Salazar, [place]
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
## 2020-09-15 ENCOUNTER — Other Ambulatory Visit: Payer: Self-pay | Admitting: Infectious Disease

## 2020-09-15 DIAGNOSIS — S7291XA Unspecified fracture of right femur, initial encounter for closed fracture: Secondary | ICD-10-CM | POA: Insufficient documentation

## 2020-09-15 DIAGNOSIS — S72351K Displaced comminuted fracture of shaft of right femur, subsequent encounter for closed fracture with nonunion: Secondary | ICD-10-CM | POA: Insufficient documentation

## 2020-09-15 DIAGNOSIS — G894 Chronic pain syndrome: Secondary | ICD-10-CM

## 2020-09-15 NOTE — Telephone Encounter (Signed)
Referral ordered

## 2020-09-27 ENCOUNTER — Other Ambulatory Visit: Payer: Self-pay | Admitting: Infectious Disease

## 2020-09-27 DIAGNOSIS — B20 Human immunodeficiency virus [HIV] disease: Secondary | ICD-10-CM

## 2020-09-27 DIAGNOSIS — K219 Gastro-esophageal reflux disease without esophagitis: Secondary | ICD-10-CM

## 2020-09-28 NOTE — Telephone Encounter (Signed)
Patient is followed by Dr. Sharyn Lull for statin refill.  Douglas Salazar

## 2020-10-03 ENCOUNTER — Telehealth: Payer: Self-pay

## 2020-10-03 ENCOUNTER — Other Ambulatory Visit: Payer: Self-pay | Admitting: Infectious Diseases

## 2020-10-03 DIAGNOSIS — R52 Pain, unspecified: Secondary | ICD-10-CM

## 2020-10-03 NOTE — Telephone Encounter (Signed)
Amb referral to pain medicine ordered, thanks

## 2020-10-03 NOTE — Telephone Encounter (Signed)
Patient requesting new pain management referral. Previous referrals sent to providers who are not accepting new patients. Requested referral be sent to [facility]. Fax # [phone].  [address] [facility], [address], [place].   Leonila Speranza Loyola Mast, RN

## 2020-10-03 NOTE — Progress Notes (Unsigned)
Pain

## 2020-10-12 ENCOUNTER — Telehealth: Payer: Self-pay | Admitting: *Deleted

## 2020-10-12 NOTE — Telephone Encounter (Signed)
Patient stated it was ok for this RN to speak with him, access his chart. Patient called to ask on the status of his pain referrals.  He states he had surgery several weeks ago and is not yet in pain management.  He is aware of the denied referral to [facility].  He is waiting on referral to 2 different providers at [facility], and at least one other, but he does not have the information with him. He is asking for the referral coordinator to call him back to see which clinics he has been referred to. Andree Coss, RN

## 2020-11-23 ENCOUNTER — Other Ambulatory Visit: Payer: Self-pay | Admitting: Infectious Disease

## 2020-11-23 ENCOUNTER — Telehealth: Payer: Self-pay

## 2020-11-23 DIAGNOSIS — B20 Human immunodeficiency virus [HIV] disease: Secondary | ICD-10-CM

## 2020-11-23 NOTE — Telephone Encounter (Signed)
Is Christiane Ha following w Dr. Sharyn Lull from Cardiology?

## 2020-11-23 NOTE — Telephone Encounter (Signed)
Received refill request for Atorvastatin from pharmacy. Medication was discontinued in 2020. Will forward message to provider to advise on refill.

## 2020-11-23 NOTE — Telephone Encounter (Signed)
Patient called office today after not receiving Omeprazole with medication delivery. Per pharmacy patient's [facility] does not cover prescription anymore. Medication is sold OTC.  Pharmacy did update patient of this in October. Patient states he would pick up medication at local pharmacy. Relayed this to patient who states he will pick up OTC today. Will call office if anything is needed from [place].

## 2020-11-23 NOTE — Telephone Encounter (Signed)
Waiting on MD response.   

## 2020-11-28 NOTE — Telephone Encounter (Signed)
Attempted to contact patient to confirm if he is following up with Cardiology. Unable to reach patient at this time. Voicemail is full and not accepting new messages. Patient will need to follow up with Cardiology regarding prescription

## 2021-01-27 ENCOUNTER — Other Ambulatory Visit: Payer: Self-pay | Admitting: Infectious Disease

## 2021-01-27 DIAGNOSIS — B20 Human immunodeficiency virus [HIV] disease: Secondary | ICD-10-CM

## 2021-02-03 ENCOUNTER — Other Ambulatory Visit: Payer: Self-pay | Admitting: Infectious Disease

## 2021-02-03 DIAGNOSIS — B20 Human immunodeficiency virus [HIV] disease: Secondary | ICD-10-CM

## 2021-03-30 ENCOUNTER — Other Ambulatory Visit: Payer: Self-pay | Admitting: Infectious Disease

## 2021-03-30 ENCOUNTER — Encounter: Payer: Self-pay | Admitting: Infectious Disease

## 2021-03-30 DIAGNOSIS — K219 Gastro-esophageal reflux disease without esophagitis: Secondary | ICD-10-CM

## 2021-04-29 ENCOUNTER — Other Ambulatory Visit: Payer: Self-pay | Admitting: Infectious Disease

## 2021-05-01 NOTE — Telephone Encounter (Signed)
Patient scheduled for 05/05/21.

## 2021-05-03 ENCOUNTER — Telehealth: Payer: Self-pay

## 2021-05-03 NOTE — Telephone Encounter (Signed)
Patient is calling requesting a refill on his Biktarvy. Patient has an appointment with you on 05/05/21. Patient asking can he have a refill on medication before his appointment on Friday. Patient has no showed several appointments in the past and reports he took his last pill yesterday.  Please advise Tomie Spizzirri T Pricilla Loveless

## 2021-05-04 ENCOUNTER — Other Ambulatory Visit: Payer: Self-pay | Admitting: Infectious Disease

## 2021-05-04 ENCOUNTER — Other Ambulatory Visit: Payer: Self-pay

## 2021-05-04 DIAGNOSIS — B20 Human immunodeficiency virus [HIV] disease: Secondary | ICD-10-CM

## 2021-05-04 NOTE — Telephone Encounter (Signed)
Refill for Biktarvy sent to the pharmacy for the patient. Patient has been advised he must keep his scheduled appointment for 05/05/21. [name]

## 2021-05-05 ENCOUNTER — Other Ambulatory Visit: Payer: Self-pay

## 2021-05-05 ENCOUNTER — Ambulatory Visit (INDEPENDENT_AMBULATORY_CARE_PROVIDER_SITE_OTHER): Payer: BC Managed Care – PPO | Admitting: Infectious Disease

## 2021-05-05 ENCOUNTER — Other Ambulatory Visit: Payer: BC Managed Care – PPO

## 2021-05-05 ENCOUNTER — Telehealth: Payer: Self-pay

## 2021-05-05 DIAGNOSIS — B20 Human immunodeficiency virus [HIV] disease: Secondary | ICD-10-CM

## 2021-05-05 NOTE — Telephone Encounter (Signed)
Attempted to reach several times as did DR Daiva Eves for phone visit today. The number listed is not working it rings then goes into busy dial tone as if disconnected. We have both called every 15 minutes since 10:15 am. Please update contact info and schedule for new follow up if anyone talks to patient. He can do phone visit but please remind him per [employer] we do not do every single visit as virtual. He will need to come in also for visits to check weight and vitals especially.

## 2021-05-08 LAB — CBC WITH DIFFERENTIAL/PLATELET
Absolute Monocytes: 844 cells/uL (ref 200–950)
Basophils Absolute: 54 cells/uL (ref 0–200)
Basophils Relative: 0.4 %
Eosinophils Absolute: 80 cells/uL (ref 15–500)
Eosinophils Relative: 0.6 %
HCT: 43.1 % (ref 38.5–50.0)
Hemoglobin: 14.1 g/dL (ref 13.2–17.1)
Lymphs Abs: 4891 cells/uL — ABNORMAL HIGH (ref 850–3900)
MCH: 26.9 pg — ABNORMAL LOW (ref 27.0–33.0)
MCHC: 32.7 g/dL (ref 32.0–36.0)
MCV: 82.1 fL (ref 80.0–100.0)
MPV: 11.3 fL (ref 7.5–12.5)
Monocytes Relative: 6.3 %
Neutro Abs: 7531 cells/uL (ref 1500–7800)
Neutrophils Relative %: 56.2 %
Platelets: 314 10*3/uL (ref 140–400)
RBC: 5.25 10*6/uL (ref 4.20–5.80)
RDW: 14.9 % (ref 11.0–15.0)
Total Lymphocyte: 36.5 %
WBC: 13.4 10*3/uL — ABNORMAL HIGH (ref 3.8–10.8)

## 2021-05-08 LAB — COMPLETE METABOLIC PANEL WITH GFR
AG Ratio: 1.3 (calc) (ref 1.0–2.5)
ALT: 19 U/L (ref 9–46)
AST: 16 U/L (ref 10–35)
Albumin: 4.2 g/dL (ref 3.6–5.1)
Alkaline phosphatase (APISO): 135 U/L (ref 35–144)
BUN: 22 mg/dL (ref 7–25)
CO2: 26 mmol/L (ref 20–32)
Calcium: 9.4 mg/dL (ref 8.6–10.3)
Chloride: 105 mmol/L (ref 98–110)
Creat: 0.98 mg/dL (ref 0.70–1.33)
GFR, Est African American: 103 mL/min/{1.73_m2} (ref >=60)
GFR, Est Non African American: 89 mL/min/{1.73_m2} (ref >=60)
Globulin: 3.3 g/dL (calc) (ref 1.9–3.7)
Glucose, Bld: 94 mg/dL (ref 65–99)
Potassium: 4.6 mmol/L (ref 3.5–5.3)
Sodium: 137 mmol/L (ref 135–146)
Total Bilirubin: 0.4 mg/dL (ref 0.2–1.2)
Total Protein: 7.5 g/dL (ref 6.1–8.1)

## 2021-05-08 LAB — RPR: RPR Ser Ql: NONREACTIVE

## 2021-05-08 LAB — T-HELPER CELLS (CD4) COUNT (NOT AT ARMC)
Absolute CD4: 1954 cells/uL — ABNORMAL HIGH (ref 490–1740)
CD4 T Helper %: 39 % (ref 30–61)
Total lymphocyte count: 4969 cells/uL — ABNORMAL HIGH (ref 850–3900)

## 2021-05-08 LAB — HIV-1 RNA QUANT-NO REFLEX-BLD
HIV 1 RNA Quant: NOT DETECTED Copies/mL
HIV-1 RNA Quant, Log: NOT DETECTED Log cps/mL

## 2021-05-08 NOTE — Progress Notes (Signed)
Patient never picked up the phone for this visit despite multiple calls by myself and staff

## 2021-06-12 ENCOUNTER — Ambulatory Visit (INDEPENDENT_AMBULATORY_CARE_PROVIDER_SITE_OTHER): Payer: BC Managed Care – PPO | Admitting: Infectious Disease

## 2021-06-12 ENCOUNTER — Encounter: Payer: Self-pay | Admitting: Infectious Disease

## 2021-06-12 ENCOUNTER — Other Ambulatory Visit: Payer: Self-pay | Admitting: Family

## 2021-06-12 ENCOUNTER — Other Ambulatory Visit: Payer: Self-pay

## 2021-06-12 DIAGNOSIS — K219 Gastro-esophageal reflux disease without esophagitis: Secondary | ICD-10-CM

## 2021-06-12 DIAGNOSIS — S72351K Displaced comminuted fracture of shaft of right femur, subsequent encounter for closed fracture with nonunion: Secondary | ICD-10-CM | POA: Diagnosis not present

## 2021-06-12 DIAGNOSIS — B20 Human immunodeficiency virus [HIV] disease: Secondary | ICD-10-CM

## 2021-06-12 DIAGNOSIS — I2510 Atherosclerotic heart disease of native coronary artery without angina pectoris: Secondary | ICD-10-CM

## 2021-06-12 DIAGNOSIS — G63 Polyneuropathy in diseases classified elsewhere: Secondary | ICD-10-CM

## 2021-06-12 DIAGNOSIS — E782 Mixed hyperlipidemia: Secondary | ICD-10-CM

## 2021-06-12 MED ORDER — ESCITALOPRAM OXALATE 10 MG PO TABS: 10.0000 mg | ORAL_TABLET | Freq: Every day | ORAL | 11 refills | Status: DC

## 2021-06-12 MED ORDER — GABAPENTIN 400 MG PO CAPS: ORAL_CAPSULE | ORAL | 11 refills | Status: DC

## 2021-06-12 MED ORDER — OMEPRAZOLE 20 MG PO CPDR: 20.0000 mg | DELAYED_RELEASE_CAPSULE | Freq: Every day | ORAL | 11 refills | Status: DC

## 2021-06-12 MED ORDER — GABAPENTIN 800 MG PO TABS: ORAL_TABLET | ORAL | 11 refills | Status: DC

## 2021-06-12 MED ORDER — LISINOPRIL 20 MG PO TABS: ORAL_TABLET | ORAL | 11 refills | Status: DC

## 2021-06-12 MED ORDER — BIKTARVY 50-200-25 MG PO TABS: 1.0000 | ORAL_TABLET | Freq: Every day | ORAL | 11 refills | Status: DC
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
## 2021-08-17 ENCOUNTER — Telehealth: Payer: BC Managed Care – PPO | Admitting: Infectious Disease

## 2021-08-17 ENCOUNTER — Telehealth: Payer: Self-pay

## 2021-08-17 ENCOUNTER — Other Ambulatory Visit: Payer: Self-pay

## 2021-08-17 ENCOUNTER — Ambulatory Visit: Payer: BC Managed Care – PPO

## 2021-08-17 NOTE — Telephone Encounter (Signed)
RN contacted patient x3 and left VM for phone visit at 11:30. Unsuccessful at getting patient. Provider aware.   Bronsyn Shappell Loyola Mast, RN

## 2022-01-23 ENCOUNTER — Other Ambulatory Visit (HOSPITAL_COMMUNITY): Payer: Self-pay

## 2022-05-30 ENCOUNTER — Other Ambulatory Visit: Payer: Self-pay

## 2022-05-30 DIAGNOSIS — K219 Gastro-esophageal reflux disease without esophagitis: Secondary | ICD-10-CM

## 2022-07-02 ENCOUNTER — Other Ambulatory Visit: Payer: Self-pay

## 2022-07-02 ENCOUNTER — Telehealth: Payer: Self-pay

## 2022-07-02 MED ORDER — BIKTARVY 50-200-25 MG PO TABS: 1.0000 | ORAL_TABLET | Freq: Every day | ORAL | 0 refills | Status: DC

## 2022-07-02 NOTE — Telephone Encounter (Signed)
Received refill requests for:  Gabapentin Lisinopril Escitalopram   Patient is overdue for office follow up. I attempted to contact patient - VM box is full.   Andrey Mccaskill Lesli Albee, CMA

## 2022-07-04 ENCOUNTER — Other Ambulatory Visit: Payer: Self-pay

## 2022-07-04 DIAGNOSIS — B20 Human immunodeficiency virus [HIV] disease: Secondary | ICD-10-CM

## 2022-07-05 MED ORDER — LISINOPRIL 20 MG PO TABS: ORAL_TABLET | ORAL | 0 refills | Status: DC

## 2022-07-05 MED ORDER — GABAPENTIN 800 MG PO TABS: ORAL_TABLET | ORAL | 0 refills | Status: DC

## 2022-07-05 MED ORDER — ESCITALOPRAM OXALATE 10 MG PO TABS: 10.0000 mg | ORAL_TABLET | Freq: Every day | ORAL | 0 refills | Status: DC

## 2022-07-27 DIAGNOSIS — B20 Human immunodeficiency virus [HIV] disease: Secondary | ICD-10-CM

## 2022-07-27 MED ORDER — BIKTARVY 50-200-25 MG PO TABS: 1.0000 | ORAL_TABLET | Freq: Every day | ORAL | 0 refills | Status: DC

## 2022-07-27 MED ORDER — ESCITALOPRAM OXALATE 10 MG PO TABS: 10.0000 mg | ORAL_TABLET | Freq: Every day | ORAL | 0 refills | Status: DC

## 2022-07-27 NOTE — Addendum Note (Signed)
Addended by: Linna Hoff D on: 07/27/2022 10:38 AM   Modules accepted: Orders

## 2022-08-01 ENCOUNTER — Ambulatory Visit: Payer: BC Managed Care – PPO

## 2022-08-01 ENCOUNTER — Ambulatory Visit (INDEPENDENT_AMBULATORY_CARE_PROVIDER_SITE_OTHER): Payer: BC Managed Care – PPO | Admitting: Pharmacist

## 2022-08-01 ENCOUNTER — Other Ambulatory Visit: Payer: Self-pay

## 2022-08-01 DIAGNOSIS — K219 Gastro-esophageal reflux disease without esophagitis: Secondary | ICD-10-CM | POA: Diagnosis not present

## 2022-08-01 DIAGNOSIS — B20 Human immunodeficiency virus [HIV] disease: Secondary | ICD-10-CM | POA: Diagnosis not present

## 2022-08-01 MED ORDER — GABAPENTIN 800 MG PO TABS: ORAL_TABLET | ORAL | 3 refills | Status: DC

## 2022-08-01 MED ORDER — OMEPRAZOLE 20 MG PO CPDR: 20.0000 mg | DELAYED_RELEASE_CAPSULE | Freq: Every day | ORAL | 3 refills | Status: DC

## 2022-08-01 MED ORDER — GABAPENTIN 400 MG PO CAPS: ORAL_CAPSULE | ORAL | 3 refills | Status: DC

## 2022-08-01 MED ORDER — LISINOPRIL 20 MG PO TABS: ORAL_TABLET | ORAL | 3 refills | Status: DC

## 2022-08-01 MED ORDER — ESCITALOPRAM OXALATE 10 MG PO TABS: 10.0000 mg | ORAL_TABLET | Freq: Every day | ORAL | 3 refills | Status: AC

## 2022-08-01 MED ORDER — BIKTARVY 50-200-25 MG PO TABS: 1.0000 | ORAL_TABLET | Freq: Every day | ORAL | 3 refills | Status: DC
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
## 2022-08-01 NOTE — Addendum Note (Signed)
Addended by: Harley Alto on: 08/01/2022 11:43 AM   Modules accepted: Orders

## 2022-08-02 LAB — T-HELPER CELLS (CD4) COUNT (NOT AT ARMC)
CD4 % Helper T Cell: 43 % (ref 33–65)
CD4 T Cell Abs: 1588 /uL (ref 400–1790)

## 2022-08-05 LAB — COMPREHENSIVE METABOLIC PANEL
AG Ratio: 1.2 (calc) (ref 1.0–2.5)
ALT: 10 U/L (ref 9–46)
AST: 10 U/L (ref 10–35)
Albumin: 4 g/dL (ref 3.6–5.1)
Alkaline phosphatase (APISO): 118 U/L (ref 35–144)
BUN: 12 mg/dL (ref 7–25)
CO2: 24 mmol/L (ref 20–32)
Calcium: 9.1 mg/dL (ref 8.6–10.3)
Chloride: 109 mmol/L (ref 98–110)
Creat: 0.85 mg/dL (ref 0.70–1.30)
Globulin: 3.3 g/dL (calc) (ref 1.9–3.7)
Glucose, Bld: 95 mg/dL (ref 65–99)
Potassium: 4.3 mmol/L (ref 3.5–5.3)
Sodium: 140 mmol/L (ref 135–146)
Total Bilirubin: 0.4 mg/dL (ref 0.2–1.2)
Total Protein: 7.3 g/dL (ref 6.1–8.1)

## 2022-08-05 LAB — CBC WITH DIFFERENTIAL/PLATELET
Absolute Monocytes: 756 cells/uL (ref 200–950)
Basophils Absolute: 36 cells/uL (ref 0–200)
Basophils Relative: 0.3 %
Eosinophils Absolute: 96 cells/uL (ref 15–500)
Eosinophils Relative: 0.8 %
HCT: 41.1 % (ref 38.5–50.0)
Hemoglobin: 13.1 g/dL — ABNORMAL LOW (ref 13.2–17.1)
Lymphs Abs: 3876 cells/uL (ref 850–3900)
MCH: 25.7 pg — ABNORMAL LOW (ref 27.0–33.0)
MCHC: 31.9 g/dL — ABNORMAL LOW (ref 32.0–36.0)
MCV: 80.7 fL (ref 80.0–100.0)
MPV: 11.2 fL (ref 7.5–12.5)
Monocytes Relative: 6.3 %
Neutro Abs: 7236 cells/uL (ref 1500–7800)
Neutrophils Relative %: 60.3 %
Platelets: 348 10*3/uL (ref 140–400)
RBC: 5.09 10*6/uL (ref 4.20–5.80)
RDW: 15.1 % — ABNORMAL HIGH (ref 11.0–15.0)
Total Lymphocyte: 32.3 %
WBC: 12 10*3/uL — ABNORMAL HIGH (ref 3.8–10.8)

## 2022-08-05 LAB — LIPID PANEL
Cholesterol: 191 mg/dL (ref <200)
HDL: 37 mg/dL — ABNORMAL LOW (ref >=40)
LDL Cholesterol (Calc): 125 mg/dL (calc) — ABNORMAL HIGH
Non-HDL Cholesterol (Calc): 154 mg/dL (calc) — ABNORMAL HIGH (ref <130)
Total CHOL/HDL Ratio: 5.2 (calc) — ABNORMAL HIGH (ref <5.0)
Triglycerides: 175 mg/dL — ABNORMAL HIGH (ref <150)

## 2022-08-05 LAB — RPR: RPR Ser Ql: NONREACTIVE

## 2022-08-05 LAB — HIV-1 RNA QUANT-NO REFLEX-BLD
HIV 1 RNA Quant: NOT DETECTED Copies/mL
HIV-1 RNA Quant, Log: NOT DETECTED Log cps/mL

## 2022-08-31 ENCOUNTER — Other Ambulatory Visit (HOSPITAL_COMMUNITY): Payer: Self-pay

## 2022-08-31 ENCOUNTER — Telehealth: Payer: Self-pay

## 2022-08-31 NOTE — Telephone Encounter (Signed)
Received notification that patient does not have coverage through [facility].   Sandie Ano, RN

## 2022-08-31 NOTE — Telephone Encounter (Signed)
PA for omeprazole sent via covermymeds, awaiting response.   Key: [id number]  Sandie Ano, RN

## 2022-09-04 ENCOUNTER — Other Ambulatory Visit (HOSPITAL_COMMUNITY): Payer: Self-pay

## 2022-10-22 ENCOUNTER — Telehealth: Payer: Self-pay

## 2022-10-22 ENCOUNTER — Ambulatory Visit: Payer: Medicaid Other | Admitting: Infectious Disease

## 2022-10-22 NOTE — Telephone Encounter (Signed)
Attempted to call patient today regarding appointment. Not able to reach him at this time. Left voicemail requesting call back. Will need to reschedule if not able to make it today.  Leatrice Jewels, RMA

## 2022-12-13 ENCOUNTER — Other Ambulatory Visit: Payer: Self-pay | Admitting: Pharmacist

## 2022-12-13 DIAGNOSIS — B20 Human immunodeficiency virus [HIV] disease: Secondary | ICD-10-CM

## 2022-12-13 NOTE — Telephone Encounter (Signed)
Left voice mail asking patient to return my call.

## 2022-12-14 NOTE — Telephone Encounter (Signed)
Patient needs an appointment with Dr. Tommy Medal before more refills will be sent in. This was explained to him when I met with him in August. - Annetta Deiss

## 2022-12-17 ENCOUNTER — Other Ambulatory Visit: Payer: Self-pay

## 2022-12-17 DIAGNOSIS — B20 Human immunodeficiency virus [HIV] disease: Secondary | ICD-10-CM

## 2022-12-17 MED ORDER — BIKTARVY 50-200-25 MG PO TABS: 1.0000 | ORAL_TABLET | Freq: Every day | ORAL | 3 refills | Status: DC

## 2022-12-17 MED ORDER — GABAPENTIN 800 MG PO TABS: ORAL_TABLET | ORAL | 3 refills | Status: DC

## 2022-12-17 MED ORDER — GABAPENTIN 400 MG PO CAPS: ORAL_CAPSULE | ORAL | 3 refills | Status: DC

## 2022-12-17 NOTE — Addendum Note (Signed)
Addended by: Valarie Cones on: 12/17/2022 04:44 PM   Modules accepted: Orders

## 2023-01-16 ENCOUNTER — Other Ambulatory Visit: Payer: Self-pay | Admitting: Pharmacist

## 2023-01-16 DIAGNOSIS — K219 Gastro-esophageal reflux disease without esophagitis: Secondary | ICD-10-CM

## 2023-01-17 ENCOUNTER — Ambulatory Visit: Payer: Medicaid Other | Admitting: Infectious Disease

## 2023-01-17 NOTE — Telephone Encounter (Signed)
Appt 1/18

## 2023-01-24 ENCOUNTER — Ambulatory Visit: Payer: Medicaid Other | Admitting: Pharmacist

## 2023-02-26 ENCOUNTER — Other Ambulatory Visit: Payer: Self-pay

## 2023-02-26 ENCOUNTER — Other Ambulatory Visit (HOSPITAL_COMMUNITY): Payer: Self-pay

## 2023-02-26 ENCOUNTER — Encounter: Payer: Self-pay | Admitting: Infectious Disease

## 2023-02-26 ENCOUNTER — Ambulatory Visit (INDEPENDENT_AMBULATORY_CARE_PROVIDER_SITE_OTHER): Payer: Medicaid Other | Admitting: Infectious Disease

## 2023-02-26 VITALS — BP 131/86 | HR 69 | Temp 97.8°F | Ht 72.0 in | Wt 220.0 lb

## 2023-02-26 DIAGNOSIS — Z7185 Encounter for immunization safety counseling: Secondary | ICD-10-CM | POA: Diagnosis not present

## 2023-02-26 DIAGNOSIS — I10 Essential (primary) hypertension: Secondary | ICD-10-CM | POA: Diagnosis not present

## 2023-02-26 DIAGNOSIS — E782 Mixed hyperlipidemia: Secondary | ICD-10-CM | POA: Diagnosis not present

## 2023-02-26 DIAGNOSIS — R635 Abnormal weight gain: Secondary | ICD-10-CM | POA: Diagnosis not present

## 2023-02-26 DIAGNOSIS — I2510 Atherosclerotic heart disease of native coronary artery without angina pectoris: Secondary | ICD-10-CM | POA: Diagnosis not present

## 2023-02-26 DIAGNOSIS — B20 Human immunodeficiency virus [HIV] disease: Secondary | ICD-10-CM

## 2023-02-26 MED ORDER — BIKTARVY 50-200-25 MG PO TABS: 1.0000 | ORAL_TABLET | Freq: Every day | ORAL | 11 refills | Status: DC

## 2023-02-26 MED ORDER — ATORVASTATIN CALCIUM 20 MG PO TABS: 20.0000 mg | ORAL_TABLET | Freq: Every day | ORAL | 11 refills | Status: DC
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
## 2023-02-27 ENCOUNTER — Telehealth: Payer: Self-pay

## 2023-02-27 LAB — T-HELPER CELLS (CD4) COUNT (NOT AT ARMC)
CD4 % Helper T Cell: 45 % (ref 33–65)
CD4 T Cell Abs: 1957 /uL — ABNORMAL HIGH (ref 400–1790)

## 2023-02-27 NOTE — Telephone Encounter (Signed)
Called patient to relay provider's message, no answer. Left HIPAA compliant voicemail requesting callback.   Beryle Flock, RN

## 2023-02-27 NOTE — Telephone Encounter (Signed)
-----   Message from Truman Hayward, MD sent at 02/27/2023  3:04 PM EST ----- He looks to be prediabetic which might eventually get on the weight loss drug he wants to be on but until he is diabetic I do not think we can get it for him although via primary care physician might be able to ----- Message ----- From: Interface, [facility] Lab Results In Sent: 02/26/2023  10:35 PM EST To: Truman Hayward, MD

## 2023-02-28 NOTE — Telephone Encounter (Signed)
Second attempt to reach patient, no answer. Voicemail left with previous attempt.   Beryle Flock, RN

## 2023-03-01 LAB — CBC WITH DIFFERENTIAL/PLATELET
Absolute Monocytes: 754 cells/uL (ref 200–950)
Basophils Absolute: 35 cells/uL (ref 0–200)
Basophils Relative: 0.3 %
Eosinophils Absolute: 70 cells/uL (ref 15–500)
Eosinophils Relative: 0.6 %
HCT: 44.5 % (ref 38.5–50.0)
Hemoglobin: 14.6 g/dL (ref 13.2–17.1)
Lymphs Abs: 5116 cells/uL — ABNORMAL HIGH (ref 850–3900)
MCH: 27 pg (ref 27.0–33.0)
MCHC: 32.8 g/dL (ref 32.0–36.0)
MCV: 82.4 fL (ref 80.0–100.0)
MPV: 11 fL (ref 7.5–12.5)
Monocytes Relative: 6.5 %
Neutro Abs: 5626 cells/uL (ref 1500–7800)
Neutrophils Relative %: 48.5 %
Platelets: 343 10*3/uL (ref 140–400)
RBC: 5.4 10*6/uL (ref 4.20–5.80)
RDW: 15.7 % — ABNORMAL HIGH (ref 11.0–15.0)
Total Lymphocyte: 44.1 %
WBC: 11.6 10*3/uL — ABNORMAL HIGH (ref 3.8–10.8)

## 2023-03-01 LAB — COMPLETE METABOLIC PANEL WITH GFR
AG Ratio: 1.3 (calc) (ref 1.0–2.5)
ALT: 13 U/L (ref 9–46)
AST: 13 U/L (ref 10–35)
Albumin: 4.2 g/dL (ref 3.6–5.1)
Alkaline phosphatase (APISO): 127 U/L (ref 35–144)
BUN: 17 mg/dL (ref 7–25)
CO2: 26 mmol/L (ref 20–32)
Calcium: 9.6 mg/dL (ref 8.6–10.3)
Chloride: 106 mmol/L (ref 98–110)
Creat: 1.01 mg/dL (ref 0.70–1.30)
Globulin: 3.3 g/dL (calc) (ref 1.9–3.7)
Glucose, Bld: 105 mg/dL — ABNORMAL HIGH (ref 65–99)
Potassium: 4.6 mmol/L (ref 3.5–5.3)
Sodium: 141 mmol/L (ref 135–146)
Total Bilirubin: 0.4 mg/dL (ref 0.2–1.2)
Total Protein: 7.5 g/dL (ref 6.1–8.1)
eGFR: 89 mL/min/{1.73_m2} (ref >=60)

## 2023-03-01 LAB — LIPID PANEL
Cholesterol: 228 mg/dL — ABNORMAL HIGH (ref <200)
HDL: 35 mg/dL — ABNORMAL LOW (ref >=40)
LDL Cholesterol (Calc): 158 mg/dL (calc) — ABNORMAL HIGH
Non-HDL Cholesterol (Calc): 193 mg/dL (calc) — ABNORMAL HIGH (ref <130)
Total CHOL/HDL Ratio: 6.5 (calc) — ABNORMAL HIGH (ref <5.0)
Triglycerides: 194 mg/dL — ABNORMAL HIGH (ref <150)

## 2023-03-01 LAB — HIV-1 RNA QUANT-NO REFLEX-BLD
HIV 1 RNA Quant: <20 Copies/mL — ABNORMAL HIGH
HIV-1 RNA Quant, Log: <1.3 Log cps/mL — ABNORMAL HIGH

## 2023-03-01 LAB — HEMOGLOBIN A1C
Hgb A1c MFr Bld: 6.4 % of total Hgb — ABNORMAL HIGH (ref <5.7)
Mean Plasma Glucose: 137 mg/dL
eAG (mmol/L): 7.6 mmol/L

## 2023-03-01 LAB — RPR: RPR Ser Ql: NONREACTIVE

## 2023-03-04 NOTE — Telephone Encounter (Signed)
Attempted to reach. No answer, no additional voicemails left at this time. Will send patient a mychart message regarding recent labs

## 2023-03-12 ENCOUNTER — Other Ambulatory Visit: Payer: Self-pay | Admitting: Pharmacist

## 2023-03-12 DIAGNOSIS — K219 Gastro-esophageal reflux disease without esophagitis: Secondary | ICD-10-CM

## 2023-03-12 NOTE — Telephone Encounter (Signed)
Please advise on refill.

## 2023-04-16 ENCOUNTER — Telehealth: Payer: Self-pay

## 2023-04-16 NOTE — Telephone Encounter (Signed)
Patient requesting a refill on Gabapentin 800 MG TID and Gabapentin 400 mg TID. If approved RX to be sent to [facility] in [place].    Douglas Salazar [employer], CMA

## 2023-04-17 ENCOUNTER — Other Ambulatory Visit: Payer: Self-pay

## 2023-04-17 DIAGNOSIS — B20 Human immunodeficiency virus [HIV] disease: Secondary | ICD-10-CM

## 2023-04-17 MED ORDER — GABAPENTIN 800 MG PO TABS
ORAL_TABLET | ORAL | 3 refills | Status: DC
Start: 2023-04-17 — End: 2023-08-19

## 2023-04-17 MED ORDER — GABAPENTIN 400 MG PO CAPS
ORAL_CAPSULE | ORAL | 3 refills | Status: DC
Start: 2023-04-17 — End: 2023-08-19

## 2023-05-31 ENCOUNTER — Telehealth: Payer: Self-pay

## 2023-05-31 NOTE — Telephone Encounter (Signed)
Received a call from patient [phone]) asking for a referral for pain management. He did not give reason for referral or location. I asked patient did he have a pcp that could send the referral and he stated that Dr. Daiva Eves is his pcp and has been for over 21 years. I told patient I will send a message to Dr. Daiva Eves about his request and patient hung up phone after asking me to send referral in.  Philippa Chester, CMA

## 2023-06-03 NOTE — Telephone Encounter (Signed)
Unable to reach patient call can not be completed and busy signal for number patient left for call back.

## 2023-07-15 ENCOUNTER — Other Ambulatory Visit: Payer: Self-pay

## 2023-07-15 DIAGNOSIS — K219 Gastro-esophageal reflux disease without esophagitis: Secondary | ICD-10-CM

## 2023-07-15 MED ORDER — OMEPRAZOLE 20 MG PO CPDR
20.0000 mg | DELAYED_RELEASE_CAPSULE | Freq: Every day | ORAL | 3 refills | Status: DC
Start: 2023-07-15 — End: 2023-11-11

## 2023-08-14 ENCOUNTER — Other Ambulatory Visit: Payer: Self-pay | Admitting: Infectious Disease

## 2023-08-14 DIAGNOSIS — B20 Human immunodeficiency virus [HIV] disease: Secondary | ICD-10-CM

## 2023-08-14 NOTE — Telephone Encounter (Signed)
Please advise if okay to refill. 

## 2023-08-15 NOTE — Telephone Encounter (Signed)
Patient has two active rx for gabapentin - do you want him on the 400 mg dose or 800 mg? Thanks!

## 2023-08-19 ENCOUNTER — Other Ambulatory Visit: Payer: Self-pay | Admitting: Infectious Disease

## 2023-08-19 DIAGNOSIS — B20 Human immunodeficiency virus [HIV] disease: Secondary | ICD-10-CM

## 2023-08-19 MED ORDER — GABAPENTIN 800 MG PO TABS
ORAL_TABLET | ORAL | 3 refills | Status: DC
Start: 2023-08-19 — End: 2023-09-04

## 2023-08-23 ENCOUNTER — Other Ambulatory Visit: Payer: Self-pay | Admitting: Infectious Disease

## 2023-08-23 DIAGNOSIS — B20 Human immunodeficiency virus [HIV] disease: Secondary | ICD-10-CM

## 2023-09-03 ENCOUNTER — Other Ambulatory Visit: Payer: Self-pay

## 2023-09-03 DIAGNOSIS — B20 Human immunodeficiency virus [HIV] disease: Secondary | ICD-10-CM

## 2023-09-03 MED ORDER — GABAPENTIN 400 MG PO CAPS
ORAL_CAPSULE | ORAL | 3 refills | Status: DC
Start: 2023-09-03 — End: 2023-09-04

## 2023-09-04 ENCOUNTER — Other Ambulatory Visit: Payer: Self-pay | Admitting: Infectious Disease

## 2023-09-04 DIAGNOSIS — B20 Human immunodeficiency virus [HIV] disease: Secondary | ICD-10-CM

## 2023-09-04 MED ORDER — GABAPENTIN 400 MG PO CAPS
ORAL_CAPSULE | ORAL | 3 refills | Status: DC
Start: 2023-09-04 — End: 2023-09-17

## 2023-09-04 MED ORDER — GABAPENTIN 800 MG PO TABS: ORAL_TABLET | ORAL | 3 refills | Status: DC

## 2023-09-17 ENCOUNTER — Telehealth: Payer: Self-pay

## 2023-09-17 ENCOUNTER — Other Ambulatory Visit: Payer: Self-pay

## 2023-09-17 MED ORDER — GABAPENTIN 600 MG PO TABS: 1200.0000 mg | ORAL_TABLET | Freq: Three times a day (TID) | ORAL | 3 refills | Status: DC

## 2023-09-17 NOTE — Telephone Encounter (Signed)
Patient's pharmacy called stating insurance will not allow 2 separate Rxs for Gabapentin. Patient is taking a total of 1200 mg TID. I have changed the Rx to 600 mg tablets TID for the patient. Does this work for you?  Aalani Aikens T Pricilla Loveless

## 2023-11-11 ENCOUNTER — Other Ambulatory Visit: Payer: Self-pay | Admitting: Infectious Disease

## 2023-11-11 ENCOUNTER — Other Ambulatory Visit: Payer: Self-pay | Admitting: Pharmacist

## 2023-11-11 DIAGNOSIS — B20 Human immunodeficiency virus [HIV] disease: Secondary | ICD-10-CM

## 2023-11-11 DIAGNOSIS — K219 Gastro-esophageal reflux disease without esophagitis: Secondary | ICD-10-CM

## 2023-12-16 ENCOUNTER — Other Ambulatory Visit: Payer: Self-pay | Admitting: Infectious Disease

## 2023-12-16 DIAGNOSIS — B20 Human immunodeficiency virus [HIV] disease: Secondary | ICD-10-CM

## 2023-12-16 DIAGNOSIS — I10 Essential (primary) hypertension: Secondary | ICD-10-CM

## 2023-12-16 NOTE — Telephone Encounter (Signed)
Pt has appt 12/17

## 2023-12-16 NOTE — Progress Notes (Deleted)
Subjective:  Chief complaint: Follow-up for HIV disease on medications   Patient ID: Douglas Salazar, male    DOB: [birth year], 52 y.o.   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    HTN (hypertension) 12/05/2016   Hyperlipidemia    Hypertension    Myocardial infarction ([facility])    Neuromuscular disorder ([facility])    Substance abuse ([facility])     Past Surgical History:  Procedure Laterality Date   ENDOVASCULAR STENT INSERTION      Family History  Problem Relation Age of Onset   Hypertension Mother    Heart disease Father    Hypertension Father    Hearing loss Paternal Grandfather    Cancer Neg Hx    Diabetes Neg Hx    Stroke Neg Hx       Social History   Socioeconomic History   Marital status: Single    Spouse name: Not on file   Number of children: Not on file   Years of education: Not on file   Highest education level: Not on file  Occupational History   Not on file  Tobacco Use   Smoking status: Former    Current packs/day: 0.00    Types: Cigarettes    Quit date: 05/31/2012    Years since quitting: 11.5   Smokeless tobacco: Never   Tobacco comments:    pt no longer smokes  Substance and Sexual Activity   Alcohol use: No    Alcohol/week: 0.0 standard drinks of alcohol   Drug use: No   Sexual activity: Not Currently    Comment: pt. declined condoms  Other Topics Concern   Not on file  Social History Narrative   Not on file   Social Drivers of Health   Financial Resource Strain: Not on file  Food Insecurity: No Food Insecurity (09/13/2019)   [facility]   Hunger Vital Sign    Worried About Running Out  of Food in the Last Year: Never true    Ran Out of Food in the Last Year: Never true  Transportation Needs: Not on file  Physical Activity: Not on file  Stress: Not on file  Social Connections: Not on file    Allergies  Allergen Reactions   Naproxen    Sulfonamide Derivatives      Current Outpatient Medications:    atorvastatin (LIPITOR) 20 MG tablet, Take 1 tablet (20 mg total) by mouth daily., Disp: 30 tablet, Rfl: 11   bictegravir-emtricitabine-tenofovir AF (BIKTARVY) 50-200-25 MG TABS tablet, Take 1 tablet by mouth daily., Disp: 30 tablet, Rfl: 11   escitalopram (LEXAPRO) 10 MG tablet, Take 1 tablet (10 mg total) by mouth daily., Disp: 30 tablet, Rfl: 3   gabapentin (NEURONTIN) 600 MG tablet, TAKE 2 TABLETS(1200 MG) BY MOUTH THREE TIMES DAILY, Disp: 90 tablet, Rfl: 0   lisinopril (ZESTRIL) 20 MG tablet, TAKE 1 TABLET(20 MG) BY MOUTH EVERY DAY, Disp: 30 tablet, Rfl: 0   nitroGLYCERIN (NITROSTAT) 0.4 MG SL tablet, Place 1 tablet (0.4 mg total) under the tongue every 5 (five) minutes as needed for chest pain., Disp:  10 tablet, Rfl: 0   omeprazole (PRILOSEC) 20 MG capsule, TAKE 1 CAPSULE(20 MG) BY MOUTH DAILY, Disp: 30 capsule, Rfl: 1   oxyCODONE-acetaminophen (PERCOCET) 10-325 MG tablet, Take 1 tablet by mouth every 4 (four) hours as needed for pain., Disp: , Rfl:    Review of Systems     Objective:   Physical Exam        Assessment & Plan:

## 2023-12-16 NOTE — Telephone Encounter (Signed)
Okay to refill? 

## 2023-12-17 ENCOUNTER — Ambulatory Visit: Payer: 59 | Admitting: Infectious Disease

## 2023-12-17 DIAGNOSIS — I10 Essential (primary) hypertension: Secondary | ICD-10-CM

## 2023-12-17 DIAGNOSIS — B20 Human immunodeficiency virus [HIV] disease: Secondary | ICD-10-CM

## 2023-12-17 DIAGNOSIS — I2510 Atherosclerotic heart disease of native coronary artery without angina pectoris: Secondary | ICD-10-CM

## 2024-01-13 ENCOUNTER — Other Ambulatory Visit: Payer: Self-pay | Admitting: Infectious Disease

## 2024-01-13 ENCOUNTER — Telehealth: Payer: Self-pay

## 2024-01-13 DIAGNOSIS — B20 Human immunodeficiency virus [HIV] disease: Secondary | ICD-10-CM

## 2024-01-13 DIAGNOSIS — K219 Gastro-esophageal reflux disease without esophagitis: Secondary | ICD-10-CM

## 2024-01-13 DIAGNOSIS — I10 Essential (primary) hypertension: Secondary | ICD-10-CM

## 2024-01-13 NOTE — Telephone Encounter (Signed)
 Pt due for appt

## 2024-01-13 NOTE — Telephone Encounter (Signed)
 Per nurse, patient needs a follow-up appointment. I attempted to make contact but phone number is invalid with no recent MyChart activity

## 2024-01-16 NOTE — Telephone Encounter (Signed)
Are you okay with refilling all of these for 30 day supply? Front desk tried to reach him since he's due for an appointment, but his phone number is invalid and he hasn't been on MyChart any time recently.

## 2024-02-10 ENCOUNTER — Encounter: Payer: Self-pay | Admitting: Infectious Disease

## 2024-02-10 DIAGNOSIS — G629 Polyneuropathy, unspecified: Secondary | ICD-10-CM | POA: Insufficient documentation

## 2024-02-10 DIAGNOSIS — K219 Gastro-esophageal reflux disease without esophagitis: Secondary | ICD-10-CM | POA: Insufficient documentation

## 2024-02-10 HISTORY — DX: Polyneuropathy, unspecified: G62.9

## 2024-02-11 ENCOUNTER — Other Ambulatory Visit (HOSPITAL_COMMUNITY)
Admission: RE | Admit: 2024-02-11 | Discharge: 2024-02-11 | Disposition: A | Discharge status: Home / Self Care | Payer: 59 | Source: Ambulatory Visit | Attending: Infectious Disease | Admitting: Infectious Disease

## 2024-02-11 ENCOUNTER — Encounter: Payer: Self-pay | Admitting: Infectious Disease

## 2024-02-11 ENCOUNTER — Ambulatory Visit (INDEPENDENT_AMBULATORY_CARE_PROVIDER_SITE_OTHER): Payer: 59 | Admitting: Infectious Disease

## 2024-02-11 ENCOUNTER — Other Ambulatory Visit (HOSPITAL_COMMUNITY): Payer: Self-pay

## 2024-02-11 ENCOUNTER — Other Ambulatory Visit: Payer: Self-pay

## 2024-02-11 VITALS — BP 138/85 | HR 85 | Resp 16 | Ht 72.0 in | Wt 238.0 lb

## 2024-02-11 DIAGNOSIS — Z23 Encounter for immunization: Secondary | ICD-10-CM

## 2024-02-11 DIAGNOSIS — B20 Human immunodeficiency virus [HIV] disease: Secondary | ICD-10-CM

## 2024-02-11 DIAGNOSIS — I10 Essential (primary) hypertension: Secondary | ICD-10-CM | POA: Insufficient documentation

## 2024-02-11 DIAGNOSIS — R7303 Prediabetes: Secondary | ICD-10-CM

## 2024-02-11 DIAGNOSIS — Z1211 Encounter for screening for malignant neoplasm of colon: Secondary | ICD-10-CM

## 2024-02-11 DIAGNOSIS — E782 Mixed hyperlipidemia: Secondary | ICD-10-CM

## 2024-02-11 DIAGNOSIS — K219 Gastro-esophageal reflux disease without esophagitis: Secondary | ICD-10-CM

## 2024-02-11 DIAGNOSIS — L989 Disorder of the skin and subcutaneous tissue, unspecified: Secondary | ICD-10-CM | POA: Diagnosis not present

## 2024-02-11 DIAGNOSIS — I2510 Atherosclerotic heart disease of native coronary artery without angina pectoris: Secondary | ICD-10-CM | POA: Diagnosis not present

## 2024-02-11 DIAGNOSIS — E669 Obesity, unspecified: Secondary | ICD-10-CM

## 2024-02-11 DIAGNOSIS — G629 Polyneuropathy, unspecified: Secondary | ICD-10-CM

## 2024-02-11 MED ORDER — OMEPRAZOLE 20 MG PO CPDR: 20.0000 mg | DELAYED_RELEASE_CAPSULE | Freq: Every day | ORAL | 11 refills | Status: DC

## 2024-02-11 MED ORDER — ATORVASTATIN CALCIUM 20 MG PO TABS: 20.0000 mg | ORAL_TABLET | Freq: Every day | ORAL | 11 refills | Status: AC

## 2024-02-11 MED ORDER — NITROGLYCERIN 0.4 MG SL SUBL
0.4000 mg | SUBLINGUAL_TABLET | SUBLINGUAL | 0 refills | Status: DC | PRN
Start: 2024-02-11 — End: 2024-03-12

## 2024-02-11 MED ORDER — GABAPENTIN 600 MG PO TABS: 1200.0000 mg | ORAL_TABLET | Freq: Three times a day (TID) | ORAL | 11 refills | Status: DC

## 2024-02-11 MED ORDER — LISINOPRIL 20 MG PO TABS: ORAL_TABLET | ORAL | 0 refills | Status: DC

## 2024-02-11 MED ORDER — BIKTARVY 50-200-25 MG PO TABS: 1.0000 | ORAL_TABLET | Freq: Every day | ORAL | 11 refills | Status: DC

## 2024-02-11 NOTE — Progress Notes (Addendum)
 Subjective:  Chief complaint follow-up for HIV disease on medications  Patient ID: Douglas Salazar, male    DOB: October 11, 1971, 53 y.o.   MRN: [id number]  HPI  Discussed the use of AI scribe software for clinical note transcription with the patient, who gave verbal consent to proceed.  History of Present Illness   Douglas Salazar, a patient with a history of HIV, presents with a longstanding leg lesion that has recently become symptomatic. The lesion, which has been present for years, is now causing discomfort and occasional soreness. The patient also reports a history of heartburn, neuropathy, and borderline diabetes as well as known CAD. He expresses a desire to lose weight and requests medication to assist with this. He has been adherent to his HIV medication, Biktarvy, and his labs have consistently shown an undetectable viral load. He also takes omeprazole for heartburn, atorvastatin for cholesterol, gabapentin for neuropathy, and lisinopril for blood pressure.       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    HTN (hypertension) 12/05/2016   Hyperlipidemia    Hypertension    Myocardial infarction [facility])    Neuromuscular disorder (HCC)    Neuropathy 02/10/2024   Substance abuse (HCC)     Past Surgical History:  Procedure Laterality Date   ENDOVASCULAR STENT INSERTION      Family History  Problem Relation Age of Onset   Hypertension Mother    Heart disease Father    Hypertension Father    Hearing loss Paternal Grandfather    Cancer Neg Hx    Diabetes Neg Hx    Stroke Neg Hx       Social History   Socioeconomic History   Marital  status: Single    Spouse name: Not on file   Number of children: Not on file   Years of education: Not on file   Highest education level: Not on file  Occupational History   Not on file  Tobacco Use   Smoking status: Former    Current packs/day: 0.00    Types: Cigarettes    Quit date: 05/31/2012    Years since quitting: 11.7   Smokeless tobacco: Never   Tobacco comments:    pt no longer smokes  Substance and Sexual Activity   Alcohol use: No    Alcohol/week: 0.0 standard drinks of alcohol   Drug use: No   Sexual activity: Not Currently    Comment: pt. declined condoms  Other Topics Concern   Not on file  Social History Narrative   Not on file   Social Drivers of Health   Financial Resource Strain: Not on file  Food Insecurity: No Food Insecurity (09/13/2019)   [facility]   Hunger Vital Sign    Worried About Running Out of Food in the Last Year: Never true    Ran Out of Food in the Last Year: Never true  Transportation Needs: Not on file  Physical Activity: Not on file  Stress: Not on file  Social Connections: Not on file    Allergies  Allergen Reactions   Naproxen  Sulfonamide Derivatives      Current Outpatient Medications:    escitalopram (LEXAPRO) 10 MG tablet, Take 1 tablet (10 mg total) by mouth daily., Disp: 30 tablet, Rfl: 3   atorvastatin (LIPITOR) 20 MG tablet, Take 1 tablet (20 mg total) by mouth daily., Disp: 30 tablet, Rfl: 11   bictegravir-emtricitabine-tenofovir AF (BIKTARVY) 50-200-25 MG TABS tablet, Take 1 tablet by mouth daily., Disp: 30 tablet, Rfl: 11   gabapentin (NEURONTIN) 600 MG tablet, Take 2 tablets (1,200 mg total) by mouth 3 (three) times daily., Disp: 180 tablet, Rfl: 11   lisinopril (ZESTRIL) 20 MG tablet, TAKE 1 TABLET(20 MG) BY MOUTH EVERY DAY, Disp: 30 tablet, Rfl: 0   nitroGLYCERIN (NITROSTAT) 0.4 MG SL tablet, Place 1 tablet (0.4 mg total) under the tongue every 5 (five) minutes as needed for  chest pain., Disp: 10 tablet, Rfl: 0   omeprazole (PRILOSEC) 20 MG capsule, Take 1 capsule (20 mg total) by mouth daily., Disp: 30 capsule, Rfl: 11    Review of Systems  Constitutional:  Negative for activity change, appetite change, chills, diaphoresis, fatigue, fever and unexpected weight change.  HENT:  Negative for congestion, rhinorrhea, sinus pressure, sneezing, sore throat and trouble swallowing.   Eyes:  Negative for photophobia and visual disturbance.  Respiratory:  Negative for cough, chest tightness, shortness of breath, wheezing and stridor.   Cardiovascular:  Negative for chest pain, palpitations and leg swelling.  Gastrointestinal:  Negative for abdominal distention, abdominal pain, anal bleeding, blood in stool, constipation, diarrhea, nausea and vomiting.  Genitourinary:  Negative for difficulty urinating, dysuria, flank pain and hematuria.  Musculoskeletal:  Negative for arthralgias, back pain, gait problem, joint swelling and myalgias.  Skin:  Positive for color change. Negative for pallor, rash and wound.  Neurological:  Negative for dizziness, tremors, weakness and light-headedness.  Hematological:  Negative for adenopathy. Does not bruise/bleed easily.  Psychiatric/Behavioral:  Negative for agitation, behavioral problems, confusion, decreased concentration, dysphoric mood and sleep disturbance.        Objective:   Physical Exam Constitutional:      Appearance: He is well-developed.  HENT:     Head: Normocephalic and atraumatic.  Eyes:     Conjunctiva/sclera: Conjunctivae normal.  Cardiovascular:     Rate and Rhythm: Normal rate and regular rhythm.  Pulmonary:     Effort: Pulmonary effort is normal. No respiratory distress.     Breath sounds: No wheezing.  Abdominal:     General: There is no distension.     Palpations: Abdomen is soft.  Musculoskeletal:        General: No tenderness. Normal range of motion.     Cervical back: Normal range of motion and neck  supple.  Skin:    General: Skin is warm and dry.     Coloration: Skin is not pale.     Findings: No erythema or rash.  Neurological:     General: No focal deficit present.     Mental Status: He is alert and oriented to person, place, and time.  Psychiatric:        Mood and Affect: Mood normal.        Behavior: Behavior normal.        Thought Content: Thought content normal.        Judgment: Judgment normal.      Lesion on left leg       Assessment & Plan:   Assessment and Plan    Skin lesion on leg Chronic lesion, recently symptomatic  with soreness. No acute changes noted. -Refer to Dermatology for further evaluation.  Pre-diabetes A1C borderline at 6.4. Patient interested in weight loss assistance. -Repeat A1C today. -Consider starting weight loss medication such as Ozempic or Mijaro pending lab results and insurance coverage.  HIV Well controlled on Biktarvy with undetectable viral load. -Continue Biktarvy --check HIV RNA, CD4 routine labs  Colonoscopy Pending appointment with Dr. Bobby Rumpf at [facility]. -Ensure referral is sent.  Vaccinations Due for flu, pneumonia, and shingles vaccines as well as COVID and he accepted all except for COVID shot -Administer flu and pneumonia vaccines today. -Advise patient to receive second shingles vaccine at pharmacy in two months.   Anal cancer prevention:  obtain anal pap smear   STI screening: check GC chlamydia, urine, OP and rectum and serum RPR  Follow-up in 6 months.     Neuropathy: refilled gabapentin  CAD: continue asa, lipitor, lisinopril  GERD continue omeprazole  I have personally spent 42 minutes involved in face-to-face and non-face-to-face activities for this patient on the day of the visit. Professional time spent includes the following activities: Preparing to see the patient (review of tests), Obtaining and/or reviewing separately obtained history (admission/discharge  record), Performing a medically appropriate examination and/or evaluation , Ordering medications/tests/procedures, referring and communicating with other health care professionals, Documenting clinical information in the EMR, Independently interpreting results (not separately reported), Communicating results to the patient/family/caregiver, Counseling and educating the patient/family/caregiver and Care coordination (not separately reported).    NOTE THE PATIENT'S HEMOGLOBIN A1C CAME BACK CONSISTENT WITH DIABETES  THIS WOULD MEET INDICATION FOR OZEMPIC  Lab Results  Component Value Date   HGBA1C 7.4 (H) 02/11/2024   HGBA1C 6.4 (H) 02/26/2023   HGBA1C 5.9 (H) 08/10/2011

## 2024-02-12 ENCOUNTER — Telehealth: Payer: Self-pay

## 2024-02-12 LAB — CYTOLOGY, (ORAL, ANAL, URETHRAL) ANCILLARY ONLY
Chlamydia: NEGATIVE
Comment: NEGATIVE
Comment: NORMAL
Comment: NEGATIVE
Comment: NORMAL
Neisseria Gonorrhea: NEGATIVE

## 2024-02-12 LAB — URINE CYTOLOGY ANCILLARY ONLY
Chlamydia: NEGATIVE
Comment: NEGATIVE
Comment: NORMAL
Neisseria Gonorrhea: NEGATIVE

## 2024-02-12 LAB — T-HELPER CELLS (CD4) COUNT (NOT AT ARMC)
CD4 % Helper T Cell: 47 % (ref 33–65)
CD4 T Cell Abs: 2369 /uL — ABNORMAL HIGH (ref 400–1790)

## 2024-02-12 NOTE — Telephone Encounter (Signed)
-----   Message from Paulette [place] sent at 02/12/2024  8:31 AM EST ----- Christiane Ha definitely has diabetes done I can we look into getting him one of the GLP blah de blah like monjauro or ozempic? ----- Message ----- From: Janace Hoard Lab Results In Sent: 02/11/2024  10:28 PM EST To: Randall Hiss, MD

## 2024-02-12 NOTE — Telephone Encounter (Signed)
Spoke with Douglas Salazar, relayed that Dr. Daiva Eves is concerned he has diabetes and our pharmacy team will be looking into coverage for GLP-1 medications. Advised that clinic staff would reach out regarding coverage determination.   Sandie Ano, RN

## 2024-02-13 LAB — CBC WITH DIFFERENTIAL/PLATELET
Absolute Lymphocytes: 5371 {cells}/uL — ABNORMAL HIGH (ref 850–3900)
Absolute Monocytes: 696 {cells}/uL (ref 200–950)
Basophils Absolute: 54 {cells}/uL (ref 0–200)
Basophils Relative: 0.5 %
Eosinophils Absolute: 118 {cells}/uL (ref 15–500)
Eosinophils Relative: 1.1 %
HCT: 43 % (ref 38.5–50.0)
Hemoglobin: 13.7 g/dL (ref 13.2–17.1)
MCH: 25.9 pg — ABNORMAL LOW (ref 27.0–33.0)
MCHC: 31.9 g/dL — ABNORMAL LOW (ref 32.0–36.0)
MCV: 81.3 fL (ref 80.0–100.0)
MPV: 11.5 fL (ref 7.5–12.5)
Monocytes Relative: 6.5 %
Neutro Abs: 4462 {cells}/uL (ref 1500–7800)
Neutrophils Relative %: 41.7 %
Platelets: 328 10*3/uL (ref 140–400)
RBC: 5.29 10*6/uL (ref 4.20–5.80)
RDW: 14.7 % (ref 11.0–15.0)
Total Lymphocyte: 50.2 %
WBC: 10.7 10*3/uL (ref 3.8–10.8)

## 2024-02-13 LAB — LIPID PANEL
Cholesterol: 183 mg/dL (ref <200)
HDL: 39 mg/dL — ABNORMAL LOW (ref >=40)
LDL Cholesterol (Calc): 112 mg/dL — ABNORMAL HIGH
Non-HDL Cholesterol (Calc): 144 mg/dL — ABNORMAL HIGH (ref <130)
Total CHOL/HDL Ratio: 4.7 (calc) (ref <5.0)
Triglycerides: 203 mg/dL — ABNORMAL HIGH (ref <150)

## 2024-02-13 LAB — RPR: RPR Ser Ql: NONREACTIVE

## 2024-02-13 LAB — COMPLETE METABOLIC PANEL WITH GFR
AG Ratio: 1.3 (calc) (ref 1.0–2.5)
ALT: 30 U/L (ref 9–46)
AST: 19 U/L (ref 10–35)
Albumin: 4.2 g/dL (ref 3.6–5.1)
Alkaline phosphatase (APISO): 131 U/L (ref 35–144)
BUN: 16 mg/dL (ref 7–25)
CO2: 25 mmol/L (ref 20–32)
Calcium: 9.3 mg/dL (ref 8.6–10.3)
Chloride: 104 mmol/L (ref 98–110)
Creat: 0.91 mg/dL (ref 0.70–1.30)
Globulin: 3.3 g/dL (ref 1.9–3.7)
Glucose, Bld: 103 mg/dL — ABNORMAL HIGH (ref 65–99)
Potassium: 4.2 mmol/L (ref 3.5–5.3)
Sodium: 138 mmol/L (ref 135–146)
Total Bilirubin: 0.4 mg/dL (ref 0.2–1.2)
Total Protein: 7.5 g/dL (ref 6.1–8.1)
eGFR: 101 mL/min/{1.73_m2} (ref >=60)

## 2024-02-13 LAB — HIV-1 RNA QUANT-NO REFLEX-BLD
HIV 1 RNA Quant: NOT DETECTED {copies}/mL
HIV-1 RNA Quant, Log: NOT DETECTED {Log}

## 2024-02-13 LAB — HEMOGLOBIN A1C
Hgb A1c MFr Bld: 7.4 %{Hb} — ABNORMAL HIGH (ref <5.7)
Mean Plasma Glucose: 166 mg/dL
eAG (mmol/L): 9.2 mmol/L

## 2024-02-18 ENCOUNTER — Telehealth: Payer: Self-pay

## 2024-02-18 ENCOUNTER — Other Ambulatory Visit: Payer: Self-pay | Admitting: Infectious Disease

## 2024-02-18 ENCOUNTER — Encounter: Payer: Self-pay | Admitting: Infectious Disease

## 2024-02-18 DIAGNOSIS — R85610 Atypical squamous cells of undetermined significance on cytologic smear of anus (ASC-US): Secondary | ICD-10-CM

## 2024-02-18 HISTORY — DX: Atypical squamous cells of undetermined significance on cytologic smear of anus (ASC-US): R85.610

## 2024-02-18 LAB — CYTOLOGY - PAP
Comment: NEGATIVE
Diagnosis: UNDETERMINED — AB
High risk HPV: POSITIVE — AB

## 2024-02-18 NOTE — Telephone Encounter (Signed)
-----   Message from [place] sent at 02/18/2024  4:16 PM EST ----- Referred for [facility] given ASCUS on anal pap ----- Message ----- From: Janace Hoard Lab Results In Sent: 02/11/2024  10:28 PM EST To: Randall Hiss, MD

## 2024-02-20 ENCOUNTER — Other Ambulatory Visit (HOSPITAL_COMMUNITY): Payer: Self-pay

## 2024-02-20 NOTE — Telephone Encounter (Signed)
 Patient called back to follow up on injections. Informed him I would reach out to pharmacy staff for update and have them call him back.  Juanita Laster, RMA

## 2024-02-21 NOTE — Telephone Encounter (Signed)
 Spoke with patient who states that he will establish care with a PCP locally. Is interested in [place]. Understands that PCP would have to prescribe and monitor this.  Will call back if anything is needed from our office. Juanita Laster, RMA

## 2024-02-21 NOTE — Telephone Encounter (Signed)
 Very much agree with Cassie

## 2024-02-21 NOTE — Telephone Encounter (Signed)
 Thanks Cece!   He is insured, so I think we need to be intentional about encouraging these patients to establish care with a PCP not just for GLP-1 management but general maintenance of other things not ID-related as well. If they are truly wanting to get on these medications and lose weight/have their diabetes managed, then they will establish with one. My opinion! Diabetes is a beast and a new diagnosis needs lots of teaching and meeting with dieticians, nutritionists, and major counseling and monitoring. We just aren't equipped with the time and resources to help with this and PCP offices are as they do this every day. For me personally, I have no experience with these medications so I am not very comfortable adjusting doses/monitoring this. Thanks everyone!

## 2024-02-21 NOTE — Telephone Encounter (Signed)
 Patient called requesting link that was sent in mychart message be emailed. Emailed to [email or web address] Juanita Laster, RMA

## 2024-02-21 NOTE — Telephone Encounter (Signed)
 Spoke with Douglas Salazar, discussed anal pap results and need for further evaluation with [facility]. Notified him that MyChart message was sent with further details and referral info. Assisted him with resetting MyChart password.   Sandie Ano, RN

## 2024-02-21 NOTE — Telephone Encounter (Signed)
 This may be better managed by a PCP's office since it has to be titrated and monitored so closely? We don't have any experience with managing these (pharmacy) and sounds like they need dose adjustments and close monitoring. Just a thought.

## 2024-02-25 NOTE — Telephone Encounter (Signed)
 Patient is calling back stating he is unsure how soon he can be seen, but has found a pcp.  He would like the initial dose of the weight loss medication sent in before seeing the pcp, so they can manage it after starting.  Please advise

## 2024-02-26 ENCOUNTER — Other Ambulatory Visit: Payer: Self-pay | Admitting: Infectious Disease

## 2024-02-26 ENCOUNTER — Other Ambulatory Visit: Payer: Self-pay

## 2024-02-26 DIAGNOSIS — R635 Abnormal weight gain: Secondary | ICD-10-CM

## 2024-02-26 MED ORDER — SEMAGLUTIDE(0.25 OR 0.5MG/DOS) 2 MG/3ML ~~LOC~~ SOPN: 0.2500 mg | PEN_INJECTOR | SUBCUTANEOUS | 1 refills | Status: DC

## 2024-02-26 NOTE — Telephone Encounter (Signed)
 Referral send to Sherley Bounds, NP in [place]. Patient aware referral has been sent.  Patient will discuss weight loss medication with their office

## 2024-02-26 NOTE — Addendum Note (Signed)
 Addended by: Tressa Busman T on: 02/26/2024 09:43 AM   Modules accepted: Orders

## 2024-02-26 NOTE — Addendum Note (Signed)
 Addended by: Tressa Busman T on: 02/26/2024 09:39 AM   Modules accepted: Orders

## 2024-02-27 NOTE — Telephone Encounter (Signed)
 Patient informed.

## 2024-03-02 ENCOUNTER — Telehealth: Payer: Self-pay

## 2024-03-02 ENCOUNTER — Other Ambulatory Visit (HOSPITAL_COMMUNITY): Payer: Self-pay

## 2024-03-02 NOTE — Telephone Encounter (Signed)
 FYI

## 2024-03-02 NOTE — Telephone Encounter (Signed)
 Received a fax regarding Prior Authorization from  [employer]  for Ozempic Authorization has been DENIED because not meeting the prior authorization requirements.   Phone# [phone]

## 2024-03-02 NOTE — Telephone Encounter (Signed)
 [facility] Patient Advocate Encounter   Received notification from [employer] that prior authorization for Ozempic is required.   PA submitted on 03/02/24 Key [id number] Status is pending    [facility] will continue to follow.   Clearance Coots, CPhT Specialty Pharmacy Patient [facility] Phone: [phone] Fax:  [phone]

## 2024-03-03 NOTE — Telephone Encounter (Signed)
 See above

## 2024-03-05 ENCOUNTER — Other Ambulatory Visit (HOSPITAL_COMMUNITY): Payer: Self-pay

## 2024-03-05 NOTE — Telephone Encounter (Signed)
 Patient called to follow up on PA denial. Would like to know what next steps would be or if alternative options can be tried.  Juanita Laster, RMA

## 2024-03-09 NOTE — Telephone Encounter (Signed)
 Dr. Daiva Eves you will need to do an addendum to you last OV note and put in there that the patient is a diabetic in order to get the Ozempic approved.  Once this is done Lupita Leash can resubmit.  Danelia Snodgrass Jonathon Resides, CMA

## 2024-03-10 ENCOUNTER — Other Ambulatory Visit: Payer: Self-pay | Admitting: Infectious Disease

## 2024-03-10 DIAGNOSIS — I10 Essential (primary) hypertension: Secondary | ICD-10-CM

## 2024-03-10 DIAGNOSIS — B20 Human immunodeficiency virus [HIV] disease: Secondary | ICD-10-CM

## 2024-03-11 NOTE — Telephone Encounter (Signed)
 Okay to refill? Can we do a few months worth of lisinopril while he's looking for PCP? Thanks!

## 2024-03-16 ENCOUNTER — Telehealth: Payer: Self-pay

## 2024-03-16 ENCOUNTER — Other Ambulatory Visit (HOSPITAL_COMMUNITY): Payer: Self-pay

## 2024-03-16 NOTE — Telephone Encounter (Signed)
 Genella Mech have you been able to resubmit this?

## 2024-03-16 NOTE — Telephone Encounter (Signed)
 FYI

## 2024-03-16 NOTE — Telephone Encounter (Signed)
 [facility] Patient Advocate Encounter  Prior Authorization for [employer] has been approved.    PA# [id number] Effective dates: 03/16/24 through 03/16/25  Patients co-pay is $4.00.   [facility] will continue to follow.  Clearance Coots, CPhT Specialty Pharmacy Patient [facility] for [facility]ious Disease Phone: [phone] Fax:  [phone]

## 2024-03-23 ENCOUNTER — Ambulatory Visit: Payer: Self-pay | Admitting: General Surgery

## 2024-03-23 NOTE — H&P (Signed)
 REFERRING PHYSICIAN:  Daiva Eves, [address]  PROVIDER:  Elenora Gamma, MD  MRN: [id number] DOB: Feb 28, 1971 DATE OF ENCOUNTER: 03/23/2024  Subjective   Chief Complaint: New Consultation ( Pap smear of anus )     History of Present Illness: Douglas Salazar is a 53 y.o. male who is seen today as an office consultation at the request of Dr. Daiva Eves for evaluation of New Consultation ( Pap smear of anus ) .  Patient underwent anal Pap on 02/11/2024.  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  He is here today for further evaluation.   Review of Systems: A complete review of systems was obtained from the patient.  I have reviewed this information and discussed as appropriate with the patient.  See HPI as well for other ROS.    Medical History: Past Medical History:  Diagnosis Date   Bell's palsy    GERD (gastroesophageal reflux disease)    HIV -AIDS with opportunistic infection, Symptomatic (CMS/HHS-HCC)    Hyperlipidemia    Hypertension    Neuropathy     There is no problem list on file for this patient.   Past Surgical History:  Procedure Laterality Date   STENT PLACEMENT INTRACRANIAL PERCUTANEOUS       Allergies  Allergen Reactions   Naproxen Shortness Of Breath   Sulfur (Bulk) Hives    Current Outpatient Medications on File Prior to Visit  Medication Sig Dispense Refill   escitalopram oxalate (LEXAPRO) 10 MG tablet Take 1 tablet by mouth once daily     lisinopriL (ZESTRIL) 20 MG tablet 1 tablet once daily     nitroGLYcerin (NITROSTAT) 0.4 MG SL tablet PLACE 1 TABLET UNDER THE TONGE EVERY 5(FIVE) MINUTES AS NEEDED FOR CHEST PAIN     omeprazole (PRILOSEC) 20 MG DR capsule Take 1 capsule by mouth once daily     atorvastatin (LIPITOR) 40 MG tablet 1 tablet once daily     BIKTARVY 50-200-25 mg tablet Take 1 tablet by mouth once daily     gabapentin (NEURONTIN) 400 MG tablet Take 1 tablet by mouth 3 (three) times daily as needed     OZEMPIC 0.25 mg or  0.5 mg (2 mg/3 mL) pen injector inject 0.25 mg into the skin once a week     No current facility-administered medications on file prior to visit.    History reviewed. No pertinent family history.   Social History   Tobacco Use  Smoking Status Former   Types: Cigarettes  Smokeless Tobacco Not on file     Social History   Socioeconomic History   Marital status: Single  Tobacco Use   Smoking status: Former    Types: Cigarettes  Vaping Use   Vaping status: Unknown  Substance and Sexual Activity   Alcohol use: Not Currently   Drug use: Never   Social Drivers of Health   Food Insecurity: No Food Insecurity (09/13/2019)   Received from [facility]   Hunger Vital Sign    Worried About Running Out of Food in the Last Year: Never true    Ran Out of Food in the Last Year: Never true    Objective:    Vitals:   03/23/24 1435  BP: (!) 142/91  Pulse: 82  Temp: 36.7 C (98 F)  SpO2: 99%  Weight: (!) 108.6 kg (239 lb 6.4 oz)  Height: 182.9 cm (6')  PainSc: 0-No pain     Exam Gen: NAD Abd: soft   Labs, Imaging  and Diagnostic Testing: HIV: <20 CD4: 2369  Assessment and Plan:  Diagnoses and all orders for this visit:  Pap smear of anus with ASCUS    53 year old male with well-controlled HIV who presents to the office with abnormal Pap test showing ASCUS.  I recommended high-resolution anoscopy with possible biopsy and ablation.  We discussed typical postoperative pain and bleeding.  All questions were answered.  Patient would like to proceed to soon as possible.  Vanita Panda, MD Colon and Rectal [facility]

## 2024-04-07 ENCOUNTER — Telehealth: Payer: Self-pay | Admitting: Infectious Disease

## 2024-04-07 NOTE — Telephone Encounter (Signed)
**Note De-identified  [place] Obfuscation** Please advise 

## 2024-04-08 ENCOUNTER — Telehealth: Payer: Self-pay

## 2024-04-08 NOTE — Telephone Encounter (Signed)
 Called Em[facility] to get additional information, no answer. Left HIPAA compliant voicemail requesting callback.   Linna Hoff, BSN, RN

## 2024-04-08 NOTE — Telephone Encounter (Signed)
 Douglas Salazar, he states he's been giving himself the 0.5 mg dose. He's been tolerating it well. Initially had some pressure in his head but says this has resolved. Keep at 0.5 mg or increase? Thanks!

## 2024-04-08 NOTE — Telephone Encounter (Signed)
 Spoke with Christiane Ha and discussed the risk for CNS depression (sedation, respiratory depression, drowsiness). He states both Dr. Mia Creek and the pharmacy also discussed this with him.   Reviewed that Dr. Daiva Eves is okay to keep gabapentin dose as is, but that it would be best to have one provider managing any controlled substances/sedating medications to prevent adverse outcomes. He says he can have Dr. Mia Creek as his PCP and will mention this to her at his upcoming appointment with her on 4/29.  Linna Hoff, BSN, RN

## 2024-04-08 NOTE — Telephone Encounter (Signed)
 Received call from [facility] with [facility] in [place].   She states patient was prescribed Vicodin (10-325 mg) on 4/3 #90 no refills by Chaney Malling with [facility] in [place], [place] for chronic pain syndrome.   They would like to know if patient's gabapentin dose needs to be adjusted to avoid CNS depression. Will route to provider.   Linna Hoff, BSN, RN

## 2024-04-08 NOTE — Telephone Encounter (Signed)
 Spoke with Douglas Salazar, he has always been on the 0.5 mg dose, states he never took the 0.25 mg dose. This was first dispensed on 3/18 he and reports he has lost about 15 pounds, but that he has not weighed himself in the last 2 weeks.   Linna Hoff, BSN, RN

## 2024-04-08 NOTE — Telephone Encounter (Signed)
 Called Terez to let him know that he would need to have different provider take over his gabapentin Rx, no answer. Left HIPAA compliant voicemail requesting callback.   Called [facility], notified Jae Dire, [facility] that okay to continue current dose of gabapentin per Dr. Daiva Eves, but that additional refills will need to be managed by Dr. Mia Creek or PCP.   Jae Dire is also planning on reaching out to [place] to discuss risk of CNS depression while concurrently using Vicodin and gabapentin.   Linna Hoff, BSN, RN

## 2024-04-20 ENCOUNTER — Encounter (HOSPITAL_BASED_OUTPATIENT_CLINIC_OR_DEPARTMENT_OTHER): Payer: Self-pay | Admitting: General Surgery

## 2024-04-20 NOTE — Progress Notes (Signed)
 Requested cardiac clearance for patients procedure through Dr. Linn Rich office. Patient states he sees a cardiologist yearly in [place] and he also has stents in his heart. No documentation from a cardiologist is in his medical record that I can find.

## 2024-04-22 NOTE — Progress Notes (Signed)
 I called and spoke with Moira Andrews at [facility]. She states that they contacted Dr Timoteo Force office about cardiac clearance on patient and they said patient needed an office visit. She said she would call office again to see what is going on as surgery is 04-24-24

## 2024-04-23 NOTE — Progress Notes (Signed)
 Spoke with Douglas Salazar at Dr. Linn Rich office regarding cardiac clearance for surgery scheduled 04/24/24. States she will get in touch with patient to see if he could get an appointment for this clearance.

## 2024-04-23 NOTE — Telephone Encounter (Signed)
 Douglas Salazar has not read MyChart message. Called him to let him know that Dr. Ernie Heal would like him to remain at 0.5 mg of Ozempic  until he can have weight and A1c re-checked.   He has an appointment with his PCP on 4/29 and will request that they take over managing this medication.   Paulene Tayag, BSN, RN

## 2024-04-23 NOTE — Progress Notes (Signed)
 Jenette Mitchell at Dr. Linn Rich office states patient has cardiology appointment scheduled 04/24/24 at 9:00am for cardiology clearance.

## 2024-04-24 ENCOUNTER — Ambulatory Visit (HOSPITAL_BASED_OUTPATIENT_CLINIC_OR_DEPARTMENT_OTHER): Admission: RE | Admit: 2024-04-24 | Source: Home / Self Care | Admitting: General Surgery

## 2024-04-24 DIAGNOSIS — Z01818 Encounter for other preprocedural examination: Secondary | ICD-10-CM

## 2024-04-24 HISTORY — DX: Nausea with vomiting, unspecified: R11.2

## 2024-04-24 HISTORY — DX: Other specified postprocedural states: Z98.890

## 2024-04-24 SURGERY — ANOSCOPY, HIGH RESOLUTION
Anesthesia: Monitor Anesthesia Care

## 2024-05-05 ENCOUNTER — Ambulatory Visit

## 2024-05-29 NOTE — Progress Notes (Signed)
 The ASCVD Risk score (Arnett DK, et al., 2019) failed to calculate for the following reasons:   Risk score cannot be calculated because patient has a medical history suggesting prior/existing ASCVD  Arlon Bergamo, BSN, RN

## 2024-06-05 ENCOUNTER — Telehealth: Payer: Self-pay

## 2024-06-05 NOTE — Telephone Encounter (Signed)
 Received call from [facility] stating patient is now getting gabapentin  through their PCP. Asked her to cancel any gabapentin  refills prescribed by Dr. Ernie Heal.   Arlon Bergamo, BSN, RN

## 2024-06-12 NOTE — Progress Notes (Signed)
 Cardiac clearance still has not been received for surgery, Jenette Mitchell at Dr Andy Bannister office notified.

## 2024-06-15 ENCOUNTER — Other Ambulatory Visit: Payer: Self-pay

## 2024-06-15 ENCOUNTER — Encounter (HOSPITAL_BASED_OUTPATIENT_CLINIC_OR_DEPARTMENT_OTHER): Payer: Self-pay | Admitting: General Surgery

## 2024-06-19 ENCOUNTER — Ambulatory Visit (HOSPITAL_BASED_OUTPATIENT_CLINIC_OR_DEPARTMENT_OTHER): Admitting: Anesthesiology

## 2024-06-19 ENCOUNTER — Encounter (HOSPITAL_BASED_OUTPATIENT_CLINIC_OR_DEPARTMENT_OTHER): Payer: Self-pay | Admitting: General Surgery

## 2024-06-19 ENCOUNTER — Encounter (HOSPITAL_BASED_OUTPATIENT_CLINIC_OR_DEPARTMENT_OTHER)
Admission: RE | Disposition: A | Discharge status: Home / Self Care | Payer: Self-pay | Source: Home / Self Care | Attending: General Surgery

## 2024-06-19 ENCOUNTER — Other Ambulatory Visit: Payer: Self-pay

## 2024-06-19 ENCOUNTER — Ambulatory Visit (HOSPITAL_BASED_OUTPATIENT_CLINIC_OR_DEPARTMENT_OTHER)
Admission: RE | Admit: 2024-06-19 | Discharge: 2024-06-19 | Disposition: A | Discharge status: Home / Self Care | Attending: General Surgery | Admitting: General Surgery

## 2024-06-19 DIAGNOSIS — Z87891 Personal history of nicotine dependence: Secondary | ICD-10-CM | POA: Diagnosis not present

## 2024-06-19 DIAGNOSIS — K6282 Dysplasia of anus: Secondary | ICD-10-CM | POA: Diagnosis not present

## 2024-06-19 DIAGNOSIS — Z21 Asymptomatic human immunodeficiency virus [HIV] infection status: Secondary | ICD-10-CM | POA: Insufficient documentation

## 2024-06-19 DIAGNOSIS — I252 Old myocardial infarction: Secondary | ICD-10-CM | POA: Insufficient documentation

## 2024-06-19 DIAGNOSIS — K219 Gastro-esophageal reflux disease without esophagitis: Secondary | ICD-10-CM | POA: Diagnosis not present

## 2024-06-19 DIAGNOSIS — F17210 Nicotine dependence, cigarettes, uncomplicated: Secondary | ICD-10-CM | POA: Diagnosis not present

## 2024-06-19 DIAGNOSIS — I10 Essential (primary) hypertension: Secondary | ICD-10-CM | POA: Diagnosis not present

## 2024-06-19 DIAGNOSIS — Z955 Presence of coronary angioplasty implant and graft: Secondary | ICD-10-CM | POA: Diagnosis not present

## 2024-06-19 DIAGNOSIS — I2510 Atherosclerotic heart disease of native coronary artery without angina pectoris: Secondary | ICD-10-CM | POA: Diagnosis not present

## 2024-06-19 DIAGNOSIS — F32A Depression, unspecified: Secondary | ICD-10-CM | POA: Insufficient documentation

## 2024-06-19 DIAGNOSIS — R85610 Atypical squamous cells of undetermined significance on cytologic smear of anus (ASC-US): Secondary | ICD-10-CM

## 2024-06-19 DIAGNOSIS — F419 Anxiety disorder, unspecified: Secondary | ICD-10-CM | POA: Insufficient documentation

## 2024-06-19 HISTORY — PX: HIGH RESOLUTION ANOSCOPY: SHX6345

## 2024-06-19 SURGERY — ANOSCOPY, HIGH RESOLUTION
Anesthesia: Monitor Anesthesia Care | Site: Rectum

## 2024-06-19 MED ORDER — PROPOFOL 500 MG/50ML IV EMUL
INTRAVENOUS | Status: DC | PRN
  Administered 2024-06-19: 100 ug/kg/min via INTRAVENOUS

## 2024-06-19 MED ORDER — LACTATED RINGERS IV SOLN: INTRAVENOUS | Status: DC

## 2024-06-19 MED ORDER — SODIUM CHLORIDE 0.9% FLUSH
3.0000 mL | Freq: Two times a day (BID) | INTRAVENOUS | Status: DC
Start: 2024-06-19 — End: 2024-06-19

## 2024-06-19 MED ORDER — ACETAMINOPHEN 500 MG PO TABS
ORAL_TABLET | ORAL | Status: AC
  Filled 2024-06-19: qty 2

## 2024-06-19 MED ORDER — MIDAZOLAM HCL 2 MG/2ML IJ SOLN
INTRAMUSCULAR | Status: AC
Start: 2024-06-19 — End: 2024-06-19
  Filled 2024-06-19: qty 2

## 2024-06-19 MED ORDER — ACETAMINOPHEN 325 MG PO TABS: 650.0000 mg | ORAL_TABLET | ORAL | Status: DC | PRN

## 2024-06-19 MED ORDER — FENTANYL CITRATE (PF) 100 MCG/2ML IJ SOLN
INTRAMUSCULAR | Status: AC
  Filled 2024-06-19: qty 2

## 2024-06-19 MED ORDER — FENTANYL CITRATE (PF) 100 MCG/2ML IJ SOLN
INTRAMUSCULAR | Status: DC | PRN
  Administered 2024-06-19: 50 ug via INTRAVENOUS

## 2024-06-19 MED ORDER — ACETIC ACID 5 % SOLN
Status: DC | PRN
Start: 2024-06-19 — End: 2024-06-19
  Administered 2024-06-19: 1 via TOPICAL

## 2024-06-19 MED ORDER — ACETAMINOPHEN 325 MG RE SUPP: 650.0000 mg | RECTAL | Status: DC | PRN

## 2024-06-19 MED ORDER — OXYCODONE HCL 5 MG PO TABS: 5.0000 mg | ORAL_TABLET | ORAL | Status: DC | PRN

## 2024-06-19 MED ORDER — ACETIC ACID 5 % SOLN
Status: AC
  Filled 2024-06-19: qty 48

## 2024-06-19 MED ORDER — SODIUM CHLORIDE 0.9 % IV SOLN: 250.0000 mL | INTRAVENOUS | Status: DC | PRN

## 2024-06-19 MED ORDER — ACETAMINOPHEN 500 MG PO TABS
1000.0000 mg | ORAL_TABLET | ORAL | Status: AC
  Administered 2024-06-19: 1000 mg via ORAL

## 2024-06-19 MED ORDER — SODIUM CHLORIDE 0.9% FLUSH
3.0000 mL | INTRAVENOUS | Status: DC | PRN
Start: 2024-06-19 — End: 2024-06-19

## 2024-06-19 MED ORDER — BUPIVACAINE-EPINEPHRINE 0.5% -1:200000 IJ SOLN
INTRAMUSCULAR | Status: DC | PRN
Start: 2024-06-19 — End: 2024-06-19
  Administered 2024-06-19: 10 mL

## 2024-06-19 MED ORDER — 0.9 % SODIUM CHLORIDE (POUR BTL) OPTIME
TOPICAL | Status: DC | PRN
  Administered 2024-06-19: 1000 mL

## 2024-06-19 MED ORDER — PROPOFOL 10 MG/ML IV BOLUS
INTRAVENOUS | Status: DC | PRN
  Administered 2024-06-19: 50 mg via INTRAVENOUS

## 2024-06-19 MED ORDER — MIDAZOLAM HCL 2 MG/2ML IJ SOLN
INTRAMUSCULAR | Status: DC | PRN
  Administered 2024-06-19: 1 mg via INTRAVENOUS

## 2024-06-19 MED ORDER — KETAMINE HCL 50 MG/5ML IJ SOSY
PREFILLED_SYRINGE | INTRAMUSCULAR | Status: AC
  Filled 2024-06-19: qty 5

## 2024-06-19 MED ORDER — KETAMINE HCL 10 MG/ML IJ SOLN
INTRAMUSCULAR | Status: DC | PRN
Start: 2024-06-19 — End: 2024-06-19
  Administered 2024-06-19: 50 mg via INTRAVENOUS

## 2024-06-19 SURGICAL SUPPLY — 33 items
BENZOIN TINCTURE PRP APPL 2/3 (GAUZE/BANDAGES/DRESSINGS) ×2 IMPLANT
BLADE SURG 10 STRL SS (BLADE) IMPLANT
BRIEF MESH DISP 2XL (UNDERPADS AND DIAPERS) ×2 IMPLANT
COVER BACK TABLE 60X90IN (DRAPES) ×2 IMPLANT
DRAPE HYSTEROSCOPY (MISCELLANEOUS) IMPLANT
DRAPE LAPAROTOMY 100X72 PEDS (DRAPES) ×2 IMPLANT
DRAPE UTILITY XL STRL (DRAPES) ×2 IMPLANT
ELECTRODE REM PT RTRN 9FT ADLT (ELECTROSURGICAL) ×2 IMPLANT
GAUZE 4X4 16PLY ~~LOC~~+RFID DBL (SPONGE) ×2 IMPLANT
GAUZE PAD ABD 8X10 STRL (GAUZE/BANDAGES/DRESSINGS) ×2 IMPLANT
GAUZE SPONGE 4X4 12PLY STRL (GAUZE/BANDAGES/DRESSINGS) IMPLANT
GLOVE BIO SURGEON STRL SZ 6.5 (GLOVE) ×2 IMPLANT
GLOVE INDICATOR 6.5 STRL GRN (GLOVE) ×2 IMPLANT
KIT SIGMOIDOSCOPE (SET/KITS/TRAYS/PACK) IMPLANT
NDL BIOPSY 14X6 SOFT TISS (NEEDLE) IMPLANT
NDL HYPO 22X1.5 SAFETY MO (MISCELLANEOUS) ×2 IMPLANT
NEEDLE BIOPSY 14X6 SOFT TISS (NEEDLE) IMPLANT
NEEDLE HYPO 22X1.5 SAFETY MO (MISCELLANEOUS) ×1 IMPLANT
NS IRRIG 1000ML POUR BTL (IV SOLUTION) ×2 IMPLANT
PACK BASIN DAY SURGERY FS (CUSTOM PROCEDURE TRAY) ×2 IMPLANT
PAD ARMBOARD POSITIONER FOAM (MISCELLANEOUS) IMPLANT
PENCIL SMOKE EVACUATOR (MISCELLANEOUS) ×2 IMPLANT
SLEEVE SCD COMPRESS KNEE MED (STOCKING) ×2 IMPLANT
SPIKE FLUID TRANSFER (MISCELLANEOUS) ×2 IMPLANT
SPONGE SURGIFOAM ABS GEL 12-7 (HEMOSTASIS) IMPLANT
SUT CHROMIC 2 0 SH (SUTURE) IMPLANT
SUT CHROMIC 3 0 SH 27 (SUTURE) IMPLANT
SYR BULB IRRIG 60ML STRL (SYRINGE) ×2 IMPLANT
SYR CONTROL 10ML LL (SYRINGE) ×2 IMPLANT
TOWEL GREEN STERILE FF (TOWEL DISPOSABLE) ×2 IMPLANT
TRAY DSU PREP LF (CUSTOM PROCEDURE TRAY) ×2 IMPLANT
TUBE CONNECTING 20X1/4 (TUBING) ×2 IMPLANT
YANKAUER SUCT BULB TIP NO VENT (SUCTIONS) IMPLANT

## 2024-06-19 NOTE — H&P (Signed)
 REFERRING PHYSICIAN:  Ernie Heal, [address]   PROVIDER:  Denese Finn, MD   MRN: [id number] DOB: Feb 21, 1971 DATE OF ENCOUNTER: 03/23/2024   Subjective    Chief Complaint: New Consultation ( Pap smear of anus )       History of Present Illness: Douglas Salazar is a 53 y.o. male who is seen today as an office consultation at the request of Dr. Ernie Heal for evaluation of New Consultation ( Pap smear of anus ) .  Patient underwent anal Pap on 02/11/2024.  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  He is here today for further evaluation.     Review of Systems: A complete review of systems was obtained from the patient.  I have reviewed this information and discussed as appropriate with the patient.  See HPI as well for other ROS.       Medical History:     Past Medical History:  Diagnosis Date   Bell's palsy     GERD (gastroesophageal reflux disease)     HIV -AIDS with opportunistic infection, Symptomatic (CMS/HHS-HCC)     Hyperlipidemia     Hypertension     Neuropathy        There is no problem list on file for this patient.          Past Surgical History:  Procedure Laterality Date   STENT PLACEMENT INTRACRANIAL PERCUTANEOUS              Allergies  Allergen Reactions   Naproxen Shortness Of Breath   Sulfur (Bulk) Hives            Current Outpatient Medications on File Prior to Visit  Medication Sig Dispense Refill   escitalopram  oxalate (LEXAPRO ) 10 MG tablet Take 1 tablet by mouth once daily       lisinopriL  (ZESTRIL ) 20 MG tablet 1 tablet once daily       nitroGLYcerin  (NITROSTAT ) 0.4 MG SL tablet PLACE 1 TABLET UNDER THE TONGE EVERY 5(FIVE) MINUTES AS NEEDED FOR CHEST PAIN       omeprazole  (PRILOSEC) 20 MG DR capsule Take 1 capsule by mouth once daily       atorvastatin  (LIPITOR) 40 MG tablet 1 tablet once daily       BIKTARVY  50-200-25 mg tablet Take 1 tablet by mouth once daily       gabapentin  (NEURONTIN ) 400 MG tablet Take 1 tablet by  mouth 3 (three) times daily as needed       OZEMPIC  0.25 mg or 0.5 mg (2 mg/3 mL) pen injector inject 0.25 mg into the skin once a week        No current facility-administered medications on file prior to visit.      History reviewed. No pertinent family history.    Social History        Tobacco Use  Smoking Status Former   Types: Cigarettes  Smokeless Tobacco Not on file      Social History         Socioeconomic History   Marital status: Single  Tobacco Use   Smoking status: Former      Types: Cigarettes  Vaping Use   Vaping status: Unknown  Substance and Sexual Activity   Alcohol use: Not Currently   Drug use: Never    Social Drivers of Health        Food Insecurity: No Food Insecurity (09/13/2019)    Received from [facility]    Hunger  Vital Sign     Worried About Programme researcher, broadcasting/film/video in the Last Year: Never true     Ran Out of Food in the Last Year: Never true      Objective:      Today's Vitals   06/15/24 1220 06/19/24 0711  BP:  (!) 111/91  Pulse:  66  Resp:  16  Temp:  (!) 97.2 F (36.2 C)  TempSrc:  Temporal  SpO2:  100%  Weight: 101.2 kg 101 kg  Height: 6' (1.829 m) 6' (1.829 m)  PainSc:  8    Body mass index is 30.2 kg/m.    Exam CV: RRR Pulm: CTA Gen: NAD Abd: soft     Labs, Imaging and Diagnostic Testing: HIV: <20 CD4: 2369   Assessment and Plan:  Diagnoses and all orders for this visit:   Pap smear of anus with ASCUS     53 year old male with well-controlled HIV who presents to the office with abnormal Pap test showing ASCUS.  I recommended high-resolution anoscopy with possible biopsy and ablation.  We discussed typical postoperative pain and bleeding.  All questions were answered.  Patient would like to proceed to soon as possible.   Fernande Howells, MD Colon and Rectal Surgery [facility]

## 2024-06-19 NOTE — Anesthesia Procedure Notes (Signed)
 Procedure Name: MAC Date/Time: 06/19/2024 9:58 AM  Performed by: Glo Larch, CRNAPre-anesthesia Checklist: Patient identified, Emergency Drugs available, Suction available and Patient being monitored Oxygen Delivery Method: Simple face mask

## 2024-06-19 NOTE — Anesthesia Preprocedure Evaluation (Addendum)
 Anesthesia Evaluation  Patient identified by MRN, date of birth, ID band Patient awake    Reviewed: Allergy & Precautions, NPO status , Patient's Chart, lab work & pertinent test results  Airway Mallampati: II  TM Distance: >3 FB Neck ROM: Full    Dental  (+) Missing   Pulmonary Current Smoker and Patient abstained from smoking.   Pulmonary exam normal        Cardiovascular hypertension, Pt. on medications + CAD, + Past MI and + Cardiac Stents  Normal cardiovascular exam     Neuro/Psych  PSYCHIATRIC DISORDERS Anxiety Depression     Neuromuscular disease    GI/Hepatic Neg liver ROS,GERD  Medicated and Controlled,,  Endo/Other  Patient on GLP-1 Agonist  Renal/GU negative Renal ROS     Musculoskeletal negative musculoskeletal ROS (+)    Abdominal  (+) + obese  Peds  Hematology  (+) HIV  Anesthesia Other Findings ASCUS  Reproductive/Obstetrics                             Anesthesia Physical Anesthesia Plan  ASA: 3  Anesthesia Plan: MAC   Post-op Pain Management:    Induction:   PONV Risk Score and Plan: 0 and Ondansetron, Dexamethasone, Propofol infusion, Midazolam and Treatment may vary due to age or medical condition  Airway Management Planned: Simple Face Mask  Additional Equipment:   Intra-op Plan:   Post-operative Plan:   Informed Consent: I have reviewed the patients History and Physical, chart, labs and discussed the procedure including the risks, benefits and alternatives for the proposed anesthesia with the patient or authorized representative who has indicated his/her understanding and acceptance.     Dental advisory given  Plan Discussed with: CRNA  Anesthesia Plan Comments:        Anesthesia Quick Evaluation

## 2024-06-19 NOTE — Discharge Instructions (Addendum)
 Beginning the day after surgery:  You may sit in a tub of warm water 2-3 times a day to relieve discomfort.  Eat a regular diet high in fiber.  Avoid foods that give you constipation or diarrhea.  Avoid foods that are difficult to digest, such as seeds, nuts, corn or popcorn.  Do not go any longer than 2 days without a bowel movement.  You may take a dose of Milk of Magnesia if you become constipated.    Drink 6-8 glasses of water daily.  Walking is encouraged.  Avoid strenuous activity and heavy lifting for one month after surgery.    Call the office if you have any questions or concerns.  Call immediately if you develop:  Excessive rectal bleeding (more than a cup or passing large clots) Increased discomfort Fever greater than 100 F Difficulty urinating  No Tylenol  until 1:23pm today, if needed

## 2024-06-19 NOTE — Op Note (Signed)
 06/19/2024  10:33 AM  PATIENT:  Douglas Salazar  54 y.o. male  Patient Care Team: Patient, No Pcp Per as PCP - General (General Practice) Ernie Heal, Jerelyn Money, MD as PCP - Infectious Diseases (Infectious Diseases)  PRE-OPERATIVE DIAGNOSIS:  ASCUS  POST-OPERATIVE DIAGNOSIS:  ASCUS  PROCEDURE:  Procedure(s): ANOSCOPY, HIGH RESOLUTION WITH BIOPSY    Surgeon(s): Joyce Nixon, MD  ASSISTANT: none   ANESTHESIA:   local and MAC  EBL: 5  Total I/O In: 800 [I.V.:800] Out: 5 [Blood:5]  SPECIMEN:  Source of Specimen:  posterior midline anal canal  DISPOSITION OF SPECIMEN:  PATHOLOGY  COUNTS:  YES  PLAN OF CARE: Discharge to home after PACU  PATIENT DISPOSITION:  PACU - hemodynamically stable.  INDICATION: 53 y.o. HIV + M with ASCUS noted on recent anal Pap test  OR FINDINGS: area of discrete acetowhite staining at dentate line posterior midline  DESCRIPTION: The patient was identified in the preoperative holding area and taken to the OR where they were laid supine on the operating room table in lithtomy position. MAC anesthesia was smoothly induced.  The patient was then prepped and draped in the usual sterile fashion. A surgical timeout was performed indicating the correct patient, procedure, positioning and preoperative antibioitics. SCDs were noted to be in place and functioning prior to the operation.   After this was completed, a sponge was soaked in 5% acetic acid was placed over the perianal region. This was allowed to soak for 2 minutes. The sponge was removed and the perianal region was evaluated with a colposcope.  There were no acetowhite external lesions present.  The internal anal canal was evaluated via anoscopy with a Hill-Ferguson anoscope.  There was an area at the dentate line that stained strongly with acetic acid.  A representative biopsy was taken.  After this was completed, ablation and hemostasis was achieved with electrocautery and the biopsy site was closed  using a 3-0 chromic suture.Additional Marcaine was injected as a field block.  A dressing was applied.  The patient was then awakened from anesthesia and sent to the postanesthesia care unit in stable condition. All counts were correct operating room staff.   Fernande Howells, MD  Colorectal and General Surgery [facility]

## 2024-06-19 NOTE — Anesthesia Postprocedure Evaluation (Signed)
 Anesthesia Post Note  Patient: Douglas Salazar  Procedure(s) Performed: ANOSCOPY, HIGH RESOLUTION WITH BIOPSY (Rectum)     Patient location during evaluation: PACU Anesthesia Type: MAC Level of consciousness: awake Pain management: pain level controlled Vital Signs Assessment: post-procedure vital signs reviewed and stable Respiratory status: spontaneous breathing, nonlabored ventilation and respiratory function stable Cardiovascular status: blood pressure returned to baseline and stable Postop Assessment: no apparent nausea or vomiting Anesthetic complications: no   No notable events documented.  Last Vitals:  Vitals:   06/19/24 1059 06/19/24 1109  BP: 134/89 (!) 134/94  Pulse: 65 64  Resp: 17 16  Temp:  (!) 36.4 C  SpO2: 100% 99%    Last Pain:  Vitals:   06/19/24 1109  TempSrc:   PainSc: 0-No pain                 Mikaelah Trostle P Oziah Vitanza

## 2024-06-19 NOTE — Transfer of Care (Signed)
 Immediate Anesthesia Transfer of Care Note  Patient: Douglas Salazar  Procedure(s) Performed: Procedure(s) (LRB): ANOSCOPY, HIGH RESOLUTION WITH BIOPSY (N/A)  Patient Location: PACU  Anesthesia Type: MAC  Level of Consciousness: awake, alert , oriented and patient cooperative  Airway & Oxygen Therapy: Patient Spontanous Breathing Room Air  Post-op Assessment: Report given to PACU RN and Post -op Vital signs reviewed and stable  Post vital signs: Reviewed and stable  Complications: No apparent anesthesia complications  Last Vitals:  Vitals Value Taken Time  BP 115/75 06/19/24 10:38  Temp    Pulse 63 06/19/24 10:42  Resp 16 06/19/24 10:42  SpO2 99 % 06/19/24 10:42  Vitals shown include unfiled device data.  Last Pain:  Vitals:   06/19/24 0711  TempSrc: Temporal  PainSc: 8          Complications: No notable events documented.

## 2024-06-20 ENCOUNTER — Encounter (HOSPITAL_BASED_OUTPATIENT_CLINIC_OR_DEPARTMENT_OTHER): Payer: Self-pay | Admitting: General Surgery

## 2024-06-22 LAB — SURGICAL PATHOLOGY

## 2024-06-30 ENCOUNTER — Other Ambulatory Visit: Payer: Self-pay | Admitting: Infectious Disease

## 2024-06-30 DIAGNOSIS — B20 Human immunodeficiency virus [HIV] disease: Secondary | ICD-10-CM

## 2024-07-11 NOTE — Patient Instructions (Signed)
 Patient Education Table of Contents Biliary Colic, Adult  To view videos and all your education online visit, [email or web address] or scan this QR code with your smartphone. Access to this content will expire in one year. Biliary Colic, Adult  Biliary colic is severe pain caused by a problem with the gallbladder. The gallbladder is a small organ in the upper right part of the abdomen. The gallbladder stores a digestive fluid produced in the liver (bile) that helps the body break down fat. Bile and other digestive enzymes are carried from the liver to the small intestine through tube-like structures called bile ducts. The gallbladder and the bile ducts form the biliary tract. Sometimes, hard deposits of digestive fluids (gallstones) form in the gallbladder and block the flow of bile from the gallbladder, causing biliary colic. This condition is also called a gallbladder attack. Gallstones can be as small as a grain of sand or as big as a golf ball. There could be just one gallstone in the gallbladder, or there could be many. What are the causes? This condition is usually caused by gallstones. Less often, a tumor could block the flow of bile from the gallbladder and trigger biliary colic. What increases the risk? The following factors may make you more likely to develop this condition: Being male. Having a family history of gallstones. Being obese. Losing weight suddenly or quickly. Eating a diet that is high in calories, low in fiber, and rich in refined carbohydrates, such as white bread and white rice. Having certain health conditions, such as: An intestinal disease that affects nutrient absorption, such as Crohn's disease. A metabolic condition, such as diabetes or metabolic syndrome. Metabolic syndrome occurs when someone has high blood pressure, high cholesterol, and diabetes. A blood condition, such as hemolytic anemia or sickle cell disease. What are the signs or  symptoms? The main symptom of this condition is severe pain in the upper right side of the abdomen. You may feel this pain below the chest but above the hip. This pain often occurs at night or after eating a meal that is high in fat. This pain may get worse for up to an hour and last as long as 12 hours. In most cases, the pain fades (subsides) within 2 hours. Other symptoms of this condition include: Nausea and vomiting. Pain under the right shoulder. How is this diagnosed? This condition is diagnosed based on your medical history, your symptoms, and a physical exam. You may also have tests, including: Blood tests to rule out infection or inflammation of the bile ducts, gallbladder, pancreas, or liver. Imaging studies, such as: An ultrasound. A CT scan. An MRI. In some cases, you may need to have an imaging study done using a small amount of radioactive material (nuclear medicine) to confirm the diagnosis. How is this treated? This condition may be treated with medicines to: Relieve your pain or nausea. Dissolve the gallstones. It may take months or years before the gallstones are completely gone. If you have gallstones, or if you have a tumor in the gallbladder that is causing biliary colic, you may need surgery to remove the gallbladder (cholecystectomy). Follow these instructions at home: Eating and drinking Drink enough fluid to keep your urine pale yellow. Follow instructions from your health care provider about eating or drinking restrictions. These may include avoiding: Fatty, greasy, and fried foods. Any foods that make the pain worse. Overeating. Having a large meal after not eating for a while. General instructions Take over-the-counter and prescription medicines  only as told by your health care provider. Keep all follow-up visits as told by your health care provider. This is important. How is this prevented? Steps to prevent this condition include: Maintaining a healthy  body weight. Getting regular exercise. Eating a healthy diet that is high in fiber and low in fat. Limiting how much sugar and refined carbohydrates you eat. Contact a health care provider if: Your pain lasts more than 5 hours. You vomit. You have a fever and chills. Your pain gets worse. Get help right away if: Your skin or the whites of your eyes look yellow (jaundice). Your have tea-colored urine and light-colored stools (feces). You are dizzy or you faint. Summary Biliary colic is severe pain caused by a problem with the gallbladder. The gallbladder is a small organ in the upper right part of your abdomen. Treatment for this condition may include medicine to relieve your pain or nausea, or medicine to slowly dissolve the gallstones. If you have gallstones, or if you have a tumor in the gallbladder that is causing biliary colic, you may need surgery to remove the gallbladder (cholecystectomy). This information is not intended to replace advice given to you by your health care provider. Make sure you discuss any questions you have with your health care provider. Document Released: 2006-05-20 Document Updated: 2022-10-31 Document Reviewed: 2022-10-31 Elsevier Patient Education  [employer].

## 2024-07-13 ENCOUNTER — Encounter: Payer: Self-pay | Admitting: General Surgery

## 2024-07-13 ENCOUNTER — Telehealth: Payer: Self-pay

## 2024-07-13 DIAGNOSIS — B20 Human immunodeficiency virus [HIV] disease: Secondary | ICD-10-CM

## 2024-07-13 MED ORDER — GABAPENTIN 600 MG PO TABS: 1200.0000 mg | ORAL_TABLET | Freq: Three times a day (TID) | ORAL | 1 refills | Status: AC

## 2024-07-13 NOTE — Telephone Encounter (Signed)
 Received call from patient, he is no longer seeing his PCP. States she is not providing his medication refills in a timely manner.   He was wondering if Dr. Fleeta Rothman would be okay with refilling his gabapentin  in the meantime. States he has not had his gabapentin  in one week. Okay to refill per Dr. Fleeta Rothman.   Also relayed HRA results to patient per Dr. Lus MyChart message. He is asking if he needs to keep his 7/21 appt with her. Encouraged him to call her office for additional information.   Douglas Salazar, BSN, Douglas Salazar

## 2024-07-14 NOTE — Telephone Encounter (Signed)
 Received voicemail from patient wanting to know if gabapentin  had been sent in. Called patient back, no answer. Left HIPAA compliant message stating that requested medication was sent and to call with any questions.   Grover Robinson, BSN, RN

## 2024-07-14 NOTE — Telephone Encounter (Signed)
 Patient called back, he states [facility] did not call him to let him know they received the prescription. Encouraged him to call [facility] and then let us  know if he has any issues getting his gabapentin . Patient verbalized understanding and has no further questions.   Monteen Toops, BSN, RN

## 2024-08-06 ENCOUNTER — Telehealth: Payer: Self-pay

## 2024-08-06 NOTE — Telephone Encounter (Signed)
 Received call from [facility] with [employer]. Reports they had been told that PCP would take over gabapentin  refills.   Discussed with her that patient decided he did not want to see that provider any longer and that gabapentin  refill was approved by Dr. Fleeta Rothman 06/2024.  She states a new Rx for Norco 10-325 mg was sent in by Dr. Arland Sierras on 7/31 #90. It has not been picked up by patient yet.   She would like to know if okay to continue filling gabapentin  due to DDI. Will route to provider.   Dr. Sierras, [facility] [address] [place], [place], [place] [address] P: [phone]   Duwaine Lowe, BSN, RN

## 2024-08-06 NOTE — Telephone Encounter (Signed)
 Patient reports his pcp is taking over the gabapentin  and has increased it to gabapentin  1200 mg TID. Douglas Salazar, CMA

## 2024-08-10 ENCOUNTER — Ambulatory Visit: Payer: 59 | Admitting: Infectious Disease

## 2024-08-10 NOTE — Telephone Encounter (Signed)
 Dr. Fleeta Rothman are you ok with the patient taking the gabapentin  Rx that you sent in along with Norco, which is being prescribed by a different doctor?

## 2024-08-11 NOTE — Telephone Encounter (Addendum)
 I spoke to [place] with [facility] and advised her ok to ship out Gabapentin  for the patient as long as Dr. Debera , who is prescribing the Norco is  aware of the Gabapentin  RX per Dr. Fleeta Rothman.  Lyle will also reach out to Dr. Debera regarding the Gabapentin . Marylynne Keelin ONEIDA Ligas, CMA

## 2024-09-01 ENCOUNTER — Other Ambulatory Visit: Payer: Self-pay | Admitting: Infectious Disease

## 2024-09-01 DIAGNOSIS — B20 Human immunodeficiency virus [HIV] disease: Secondary | ICD-10-CM

## 2024-09-06 NOTE — Progress Notes (Deleted)
 Subjective:  Chief complaint: follow-up for HIV disease on medications   Patient ID: Douglas Salazar, male    DOB: 1971/04/12, 53 y.o.   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;  Service: General;  Laterality: N/A;    Family History  Problem Relation Age of Onset   Hypertension Mother    Heart disease Father    Hypertension Father    Hearing loss Paternal Grandfather    Cancer Neg Hx    Diabetes Neg Hx    Stroke Neg Hx       Social History   Socioeconomic History   Marital status: Single    Spouse name: Not on file   Number of children: Not on file   Years of education: Not on file   Highest education level: Not on file  Occupational History   Not on file  Tobacco Use   Smoking status: Every Day    Current packs/day: 0.00    Types: Cigarettes    Last attempt to quit: 05/31/2012    Years since quitting: 12.2   Smokeless tobacco: Never   Tobacco comments:    pt no longer smokes  Substance and Sexual Activity   Alcohol use: No    Alcohol/week: 0.0 standard  drinks of alcohol   Drug use: No   Sexual activity: Not Currently    Comment: pt. declined condoms  Other Topics Concern   Not on file  Social History Narrative   Not on file   Social Drivers of Health   Financial Resource Strain: Not on file  Food Insecurity: No Food Insecurity (09/13/2019)   Received from [facility]   Hunger Vital Sign    Within the past 12 months, you worried that your food would run out before you got the money to buy more.: Never true    Within the past 12 months, the food you bought just didn't last and you didn't have money to get more.: Never true  Transportation Needs: Not on file  Physical Activity: Not on file  Stress: Not on file  Social Connections: Not on file    Allergies  Allergen Reactions   Naproxen Anaphylaxis, Hives and Swelling   Sulfonamide Derivatives Anaphylaxis, Hives and Swelling     Current Outpatient Medications:    atorvastatin  (LIPITOR) 20 MG tablet, Take 1 tablet (20 mg total) by mouth daily., Disp: 30 tablet, Rfl: 11   bictegravir-emtricitabine -tenofovir  AF (BIKTARVY ) 50-200-25 MG TABS tablet, Take  1 tablet by mouth daily., Disp: 30 tablet, Rfl: 11   escitalopram  (LEXAPRO ) 10 MG tablet, Take 1 tablet (10 mg total) by mouth daily., Disp: 30 tablet, Rfl: 3   gabapentin  (NEURONTIN ) 600 MG tablet, Take 2 tablets (1,200 mg total) by mouth 3 (three) times daily., Disp: 180 tablet, Rfl: 1   HYDROcodone-acetaminophen  (NORCO) 10-325 MG tablet, Take 1 tablet by mouth 3 (three) times daily., Disp: , Rfl:    lisinopril  (ZESTRIL ) 20 MG tablet, TAKE 1 TABLET(20 MG) BY MOUTH EVERY DAY, Disp: 30 tablet, Rfl: 2   nitroGLYCERIN  (NITROSTAT ) 0.4 MG SL tablet, PLACE 1 TABLET UNDER THE TONGE EVERY 5(FIVE) MINUTES AS NEEDED FOR CHEST PAIN, Disp: 10 tablet, Rfl: 0   omeprazole  (PRILOSEC) 20 MG capsule, Take 1 capsule (20 mg total) by mouth daily., Disp: 30 capsule, Rfl: 11   Semaglutide ,0.25 or 0.5MG /DOS, (OZEMPIC , 0.25 OR 0.5 MG/DOSE,) 2 MG/3ML  SOPN, Inject 0.5 mg into the skin once a week., Disp: 3 mL, Rfl: 1   Review of Systems     Objective:   Physical Exam        Assessment & Plan:

## 2024-09-07 ENCOUNTER — Ambulatory Visit: Admitting: Infectious Disease

## 2024-09-07 DIAGNOSIS — I2510 Atherosclerotic heart disease of native coronary artery without angina pectoris: Secondary | ICD-10-CM

## 2024-09-07 DIAGNOSIS — I10 Essential (primary) hypertension: Secondary | ICD-10-CM

## 2024-09-07 DIAGNOSIS — R85610 Atypical squamous cells of undetermined significance on cytologic smear of anus (ASC-US): Secondary | ICD-10-CM

## 2024-09-07 DIAGNOSIS — E782 Mixed hyperlipidemia: Secondary | ICD-10-CM

## 2024-09-07 DIAGNOSIS — B20 Human immunodeficiency virus [HIV] disease: Secondary | ICD-10-CM

## 2024-09-07 DIAGNOSIS — G8929 Other chronic pain: Secondary | ICD-10-CM

## 2024-09-24 ENCOUNTER — Other Ambulatory Visit: Payer: Self-pay

## 2024-09-24 ENCOUNTER — Ambulatory Visit

## 2024-10-07 ENCOUNTER — Ambulatory Visit: Admitting: Infectious Disease

## 2024-11-02 ENCOUNTER — Other Ambulatory Visit: Payer: Self-pay | Admitting: Infectious Disease

## 2024-11-02 DIAGNOSIS — K219 Gastro-esophageal reflux disease without esophagitis: Secondary | ICD-10-CM

## 2024-11-02 NOTE — Telephone Encounter (Signed)
Switched to 90 day supply

## 2024-12-01 ENCOUNTER — Ambulatory Visit

## 2024-12-01 ENCOUNTER — Ambulatory Visit: Admitting: Family

## 2024-12-28 ENCOUNTER — Telehealth: Payer: Self-pay

## 2024-12-28 NOTE — Telephone Encounter (Signed)
 Patient called requesting dose change on Ozempic , states he is stagnant at 203 pounds. Reminded him that his PCP is managing this, he will reach out to her office.   Douglas Salazar, BSN, RN

## 2025-01-05 ENCOUNTER — Ambulatory Visit

## 2025-01-27 ENCOUNTER — Other Ambulatory Visit: Payer: Self-pay | Admitting: Infectious Disease

## 2025-01-27 DIAGNOSIS — B20 Human immunodeficiency virus [HIV] disease: Secondary | ICD-10-CM

## 2025-01-27 NOTE — Telephone Encounter (Signed)
 Hey can someone please get patient scheduled for a follow up?

## 2025-03-08 ENCOUNTER — Ambulatory Visit: Payer: Self-pay | Admitting: Infectious Disease
